# Patient Record
Sex: Male | Born: 1970
Health system: Southern US, Community
[De-identification: ages and names within clinical notes are randomized; demographics above are authoritative.]

## PROBLEM LIST (undated history)

## (undated) DIAGNOSIS — R768 Other specified abnormal immunological findings in serum: Secondary | ICD-10-CM

## (undated) DIAGNOSIS — K259 Gastric ulcer, unspecified as acute or chronic, without hemorrhage or perforation: Secondary | ICD-10-CM

## (undated) DIAGNOSIS — K589 Irritable bowel syndrome without diarrhea: Secondary | ICD-10-CM

## (undated) DIAGNOSIS — A048 Other specified bacterial intestinal infections: Secondary | ICD-10-CM

## (undated) DIAGNOSIS — D759 Disease of blood and blood-forming organs, unspecified: Secondary | ICD-10-CM

## (undated) DIAGNOSIS — K611 Rectal abscess: Secondary | ICD-10-CM

## (undated) DIAGNOSIS — C61 Malignant neoplasm of prostate: Secondary | ICD-10-CM

## (undated) HISTORY — DX: Other specified abnormal immunological findings in serum: R76.8

## (undated) HISTORY — PX: COLONOSCOPY: SHX174

## (undated) HISTORY — DX: Rectal abscess: K61.1

## (undated) HISTORY — DX: Disease of blood and blood-forming organs, unspecified: D75.9

## (undated) HISTORY — DX: Other specified bacterial intestinal infections: A04.8

## (undated) HISTORY — PX: ESOPHAGOGASTRODUODENOSCOPY: SHX1529

## (undated) HISTORY — DX: Irritable bowel syndrome without diarrhea: K58.9

## (undated) HISTORY — DX: Malignant neoplasm of prostate: C61

## (undated) HISTORY — DX: Gastric ulcer, unspecified as acute or chronic, without hemorrhage or perforation: K25.9

## (undated) HISTORY — PX: ANKLE SURGERY: SHX546

---

## 1998-07-12 ENCOUNTER — Emergency Department (HOSPITAL_COMMUNITY): Admission: EM | Admit: 1998-07-12 | Discharge: 1998-07-12 | Payer: Self-pay | Admitting: Emergency Medicine

## 2000-05-12 ENCOUNTER — Emergency Department (HOSPITAL_COMMUNITY): Admission: EM | Admit: 2000-05-12 | Discharge: 2000-05-12 | Payer: Self-pay | Admitting: Emergency Medicine

## 2000-05-12 ENCOUNTER — Encounter: Payer: Self-pay | Admitting: Emergency Medicine

## 2002-05-18 ENCOUNTER — Emergency Department (HOSPITAL_COMMUNITY): Admission: EM | Admit: 2002-05-18 | Discharge: 2002-05-18 | Payer: Self-pay | Admitting: Emergency Medicine

## 2002-09-28 ENCOUNTER — Emergency Department (HOSPITAL_COMMUNITY): Admission: EM | Admit: 2002-09-28 | Discharge: 2002-09-28 | Payer: Self-pay | Admitting: Emergency Medicine

## 2002-12-20 ENCOUNTER — Emergency Department (HOSPITAL_COMMUNITY): Admission: EM | Admit: 2002-12-20 | Discharge: 2002-12-20 | Payer: Self-pay | Admitting: Emergency Medicine

## 2003-09-24 ENCOUNTER — Emergency Department (HOSPITAL_COMMUNITY): Admission: EM | Admit: 2003-09-24 | Discharge: 2003-09-24 | Payer: Self-pay | Admitting: Emergency Medicine

## 2007-02-28 ENCOUNTER — Emergency Department (HOSPITAL_COMMUNITY): Admission: EM | Admit: 2007-02-28 | Discharge: 2007-03-01 | Payer: Self-pay | Admitting: Emergency Medicine

## 2008-12-12 ENCOUNTER — Emergency Department (HOSPITAL_COMMUNITY): Admission: EM | Admit: 2008-12-12 | Discharge: 2008-12-12 | Payer: Self-pay | Admitting: Emergency Medicine

## 2011-06-04 LAB — BASIC METABOLIC PANEL
BUN: 14
CO2: 27
Calcium: 9.2
Chloride: 106
Creatinine, Ser: 1.18
GFR calc Af Amer: >60
GFR calc non Af Amer: >60
Glucose, Bld: 99
Potassium: 4.1
Sodium: 138

## 2011-06-04 LAB — DIFFERENTIAL
Basophils Absolute: 0
Basophils Relative: 0
Eosinophils Absolute: 0.4
Eosinophils Relative: 5
Lymphocytes Relative: 34
Lymphs Abs: 2.7
Monocytes Absolute: 0.7
Monocytes Relative: 8
Neutro Abs: 4.1
Neutrophils Relative %: 53

## 2011-06-04 LAB — CBC
HCT: 43.6
Hemoglobin: 14.7
MCHC: 33.8
MCV: 85.8
Platelets: 172
RBC: 5.08
RDW: 14.1 — ABNORMAL HIGH
WBC: 7.9

## 2012-08-19 DIAGNOSIS — C61 Malignant neoplasm of prostate: Secondary | ICD-10-CM

## 2012-08-19 HISTORY — DX: Malignant neoplasm of prostate: C61

## 2012-08-19 HISTORY — PX: PROSTATE BIOPSY: SHX241

## 2012-12-15 ENCOUNTER — Ambulatory Visit (INDEPENDENT_AMBULATORY_CARE_PROVIDER_SITE_OTHER): Payer: 59 | Admitting: Internal Medicine

## 2012-12-15 VITALS — BP 143/88 | HR 81 | Temp 98.8°F | Resp 18 | Wt 176.0 lb

## 2012-12-15 DIAGNOSIS — L03317 Cellulitis of buttock: Secondary | ICD-10-CM

## 2012-12-15 DIAGNOSIS — F17200 Nicotine dependence, unspecified, uncomplicated: Secondary | ICD-10-CM

## 2012-12-15 DIAGNOSIS — Z720 Tobacco use: Secondary | ICD-10-CM | POA: Insufficient documentation

## 2012-12-15 DIAGNOSIS — L0231 Cutaneous abscess of buttock: Secondary | ICD-10-CM

## 2012-12-15 MED ORDER — DOXYCYCLINE HYCLATE 100 MG PO TABS: 100.0000 mg | ORAL_TABLET | Freq: Two times a day (BID) | ORAL | Status: DC

## 2012-12-15 MED ORDER — HYDROCODONE-ACETAMINOPHEN 10-325 MG PO TABS: 1.0000 | ORAL_TABLET | Freq: Four times a day (QID) | ORAL | Status: DC | PRN

## 2012-12-15 NOTE — Progress Notes (Signed)
  Subjective:    Patient ID: Samuel Diaz, male    DOB: November 26, 1970, 42 y.o.   MRN: [id number]  HPIpainful swelling buttock 8 days No fever Able to have BM today tho pain with sitting and pain kept him awake last pm No meds or underlying problems   Review of Systems negative    Objective:   Physical Exam BP 143/88  Pulse 81  Temp(Src) 98.8 F (37.1 C) (Oral)  Resp 18  Wt 176 lb (79.833 kg) Fluctuant mass Lgluteal cleft approaching but not passing anal verge  Procedure I&D R Dunn PAC      Assessment & Plan:  Gluteal abscess Hot soaks tid Doxycycline Culture F/u 48h

## 2012-12-15 NOTE — Progress Notes (Signed)
   Patient ID: Keiran Gaffey MRN: [id number], DOB: [birth year], 42 y.o. Date of Encounter: 12/15/2012, 10:25 AM    PROCEDURE NOTE: Verbal consent obtained. Risks and benefits of the procedure were explained to the patient. Patient made an informed decision to proceed with the procedure. Betadine prep per usual protocol. Local anesthesia obtained with 2% plain lidocaine 3 cc.  1 cm incision made with 11 blade along lesion.  Culture taken. Copious purulence expressed. Lesion explored revealing no loculations. Does not appear to be dissecting.  Irrigated with normal saline.  Unable to advance packing secondary to soft skin flap.  Patient will perform hot soaks .  Dressed. Wound care instructions including precautions with patient. Patient tolerated the procedure well. Recheck in 48 hours.      SignedEula Listen, PA-C 12/15/2012 10:25 AM

## 2012-12-17 ENCOUNTER — Ambulatory Visit (INDEPENDENT_AMBULATORY_CARE_PROVIDER_SITE_OTHER): Payer: 59 | Admitting: Physician Assistant

## 2012-12-17 VITALS — BP 130/85 | HR 77 | Temp 98.5°F | Resp 18 | Wt 178.0 lb

## 2012-12-17 DIAGNOSIS — L03317 Cellulitis of buttock: Secondary | ICD-10-CM

## 2012-12-17 DIAGNOSIS — L0231 Cutaneous abscess of buttock: Secondary | ICD-10-CM

## 2012-12-17 NOTE — Progress Notes (Signed)
   Patient ID: Zebulun Deman MRN: [id number], DOB: 1971/08/03 42 y.o. Date of Encounter: 12/17/2012, 8:06 AM  Primary Physician: No PCP Per Patient  Chief Complaint: Wound care   See previous note  HPI: 42 y.o. male presents for wound care s/p I&D on 12/15/12 Doing well No issues or complaints Afebrile/ no chills No nausea or vomiting Tolerating doxycycline Pain resolved "I could have jogged here."  Daily dressing change Previous note reviewed  No past medical history on file.   Home Meds: Prior to Admission medications   Medication Sig Start Date End Date Taking? Authorizing Provider  doxycycline (VIBRA-TABS) 100 MG tablet Take 1 tablet (100 mg total) by mouth 2 (two) times daily. 12/15/12  Yes Tonye Pearson, MD  HYDROcodone-acetaminophen (NORCO) 10-325 MG per tablet Take 1 tablet by mouth every 6 (six) hours as needed for pain. 12/15/12  Yes Tonye Pearson, MD    Allergies: No Known Allergies  ROS: Constitutional: Afebrile, no chills Cardiovascular: negative for chest pain or palpitations Dermatological: Positive for wound. Negative for erythema, pain, or warmth   GI: No nausea or vomiting   EXAM: Physical Exam: Blood pressure 130/85, pulse 77, temperature 98.5 F (36.9 C), temperature source Oral, resp. rate 18, weight 178 lb (80.74 kg)., There is no height on file to calculate BMI. General: Well developed, well nourished, in no acute distress. Nontoxic appearing. Head: Normocephalic, atraumatic, sclera non-icteric.  Neck: Supple. Lungs: Breathing is unlabored. Heart: Normal rate. Skin:  Warm and moist. Dressing in place. No induration, erythema, or tenderness to palpation. Neuro: Alert and oriented X 3. Moves all extremities spontaneously. Normal gait.  Psych:  Responds to questions appropriately with a normal affect.   PROCEDURE: Dressing removed. No purulence expressed Wound bed healthy Irrigated with 1% plain lidocaine 5 cc. Dressing  applied  LAB: Culture: pending  A/P: 42 y.o. male with resolved cellulitis/abscess as above s/p I&D on 12/15/12 -Wound care per above -Continue doxycycline -No pain -Continue sitz baths -Daily dressing changes -RTC prn -Patient did ask for remaining week off from work, unfortunately this is not medically indicated at this time  Elinor Dodge, PA-C 12/17/2012 8:06 AM

## 2012-12-18 LAB — WOUND CULTURE: Gram Stain: NONE SEEN

## 2013-03-17 ENCOUNTER — Ambulatory Visit (INDEPENDENT_AMBULATORY_CARE_PROVIDER_SITE_OTHER): Payer: 59 | Admitting: Emergency Medicine

## 2013-03-17 VITALS — BP 130/80 | HR 86 | Temp 98.0°F | Resp 16 | Ht 69.0 in | Wt 169.0 lb

## 2013-03-17 DIAGNOSIS — L0292 Furuncle, unspecified: Secondary | ICD-10-CM

## 2013-03-17 DIAGNOSIS — A048 Other specified bacterial intestinal infections: Secondary | ICD-10-CM

## 2013-03-17 DIAGNOSIS — G8929 Other chronic pain: Secondary | ICD-10-CM

## 2013-03-17 DIAGNOSIS — R1013 Epigastric pain: Secondary | ICD-10-CM

## 2013-03-17 DIAGNOSIS — N4289 Other specified disorders of prostate: Secondary | ICD-10-CM

## 2013-03-17 HISTORY — DX: Other specified bacterial intestinal infections: A04.8

## 2013-03-17 LAB — POCT CBC
Hemoglobin: 14.6 g/dL (ref 14.1–18.1)
MCHC: 32.2 g/dL (ref 31.8–35.4)
MPV: 10.2 fL (ref 0–99.8)
POC Granulocyte: 5 (ref 2–6.9)
POC MID %: 8.3 %M (ref 0–12)
RBC: 4.94 M/uL (ref 4.69–6.13)

## 2013-03-17 LAB — COMPREHENSIVE METABOLIC PANEL
ALT: 13 U/L (ref 0–53)
CO2: 25 mEq/L (ref 19–32)
Chloride: 105 mEq/L (ref 96–112)
Sodium: 141 mEq/L (ref 135–145)
Total Bilirubin: 0.7 mg/dL (ref 0.3–1.2)
Total Protein: 7.4 g/dL (ref 6.0–8.3)

## 2013-03-17 MED ORDER — OMEPRAZOLE 20 MG PO CPDR: 20.0000 mg | DELAYED_RELEASE_CAPSULE | Freq: Every day | ORAL | Status: DC

## 2013-03-17 MED ORDER — DOXYCYCLINE HYCLATE 100 MG PO TABS: 100.0000 mg | ORAL_TABLET | Freq: Two times a day (BID) | ORAL | Status: DC

## 2013-03-17 NOTE — Progress Notes (Signed)
  Subjective:    Patient ID: Samuel Diaz, male    DOB: 10-14-1970, 42 y.o.   MRN: [id number]  HPI problem #1 is chronic intermittent upper abdominal discomfort. He complains of pain sometimes on the right sometimes on the left which seems to come and go. Problem #2 is recurrent boils that he gets in his perianal area and around his buttocks. He has been treated with doxycycline in the past .    Review of Systems     Objective:   Physical Exam Patient is alert and cooperative. He has pearllike areas on his buttocks. He also has a small cystic area in the perineum. Genitourinary exam is normal. Testes are descended. Prostate exam reveals the right lobe of the prostate to be significantly larger than the left       Assessment & Plan:  Routine labs were done. Urological referral made because of the prostate asymmetry. PSA was also done

## 2013-03-18 LAB — H. PYLORI ANTIBODY, IGG: H Pylori IgG: 1.31 {ISR} — ABNORMAL HIGH

## 2013-03-24 ENCOUNTER — Telehealth: Payer: Self-pay

## 2013-03-24 NOTE — Telephone Encounter (Signed)
Pt would like to know if the medication (pt unsure of the name of the medication but he said it was for stomach bacteria)  he was prescribed by Dr.Daub could be called in as a generic because it is too expensive.  Best# [phone]

## 2013-03-24 NOTE — Telephone Encounter (Signed)
3 generic prescription as Prevacid 30 mg twice a day for 14 days Biaxin 500 mg twice a day for 14 days and amoxicillin 1 g twice a day for 14 days please call this prescription in.

## 2013-03-25 MED ORDER — AMOXICILLIN 500 MG PO CAPS: 1000.0000 mg | ORAL_CAPSULE | Freq: Two times a day (BID) | ORAL | Status: DC

## 2013-03-25 MED ORDER — CLARITHROMYCIN 500 MG PO TABS: 500.0000 mg | ORAL_TABLET | Freq: Two times a day (BID) | ORAL | Status: DC

## 2013-03-25 NOTE — Telephone Encounter (Signed)
Done, used omeprazole instead of prevacid, since prevacid not preferred drug/

## 2013-04-16 ENCOUNTER — Encounter: Payer: Self-pay | Admitting: Internal Medicine

## 2013-04-30 ENCOUNTER — Encounter: Payer: Self-pay | Admitting: *Deleted

## 2013-05-12 ENCOUNTER — Encounter: Payer: Self-pay | Admitting: Internal Medicine

## 2013-05-12 ENCOUNTER — Ambulatory Visit (INDEPENDENT_AMBULATORY_CARE_PROVIDER_SITE_OTHER): Payer: 59 | Admitting: Internal Medicine

## 2013-05-12 VITALS — BP 130/90 | HR 60 | Ht 69.5 in | Wt 168.0 lb

## 2013-05-12 DIAGNOSIS — Z8711 Personal history of peptic ulcer disease: Secondary | ICD-10-CM

## 2013-05-12 DIAGNOSIS — K921 Melena: Secondary | ICD-10-CM

## 2013-05-12 DIAGNOSIS — R894 Abnormal immunological findings in specimens from other organs, systems and tissues: Secondary | ICD-10-CM

## 2013-05-12 DIAGNOSIS — R768 Other specified abnormal immunological findings in serum: Secondary | ICD-10-CM

## 2013-05-12 MED ORDER — NA SULFATE-K SULFATE-MG SULF 17.5-3.13-1.6 GM/177ML PO SOLN: ORAL | Status: DC

## 2013-05-12 NOTE — Patient Instructions (Addendum)
You have been scheduled for a colonoscopy with propofol. Please follow written instructions given to you at your visit today.  Please pick up your prep kit at the pharmacy within the next 1-3 days. If you use inhalers (even only as needed), please bring them with you on the day of your procedure. Your physician has requested that you go to www.startemmi.com and enter the access code given to you at your visit today. This web site gives a general overview about your procedure. However, you should still follow specific instructions given to you by our office regarding your preparation for the procedure.  Today we are giving you information on H. Pylori to read.   I appreciate the opportunity to care for you.

## 2013-05-13 ENCOUNTER — Encounter: Payer: Self-pay | Admitting: Internal Medicine

## 2013-05-13 DIAGNOSIS — Z8711 Personal history of peptic ulcer disease: Secondary | ICD-10-CM | POA: Insufficient documentation

## 2013-05-13 DIAGNOSIS — R768 Other specified abnormal immunological findings in serum: Secondary | ICD-10-CM

## 2013-05-13 DIAGNOSIS — K921 Melena: Secondary | ICD-10-CM | POA: Insufficient documentation

## 2013-05-13 DIAGNOSIS — R7689 Other specified abnormal immunological findings in serum: Secondary | ICD-10-CM | POA: Insufficient documentation

## 2013-05-13 HISTORY — DX: Other specified abnormal immunological findings in serum: R76.8

## 2013-05-13 NOTE — Assessment & Plan Note (Signed)
Treated with Biaxin, amoxicillin and PPI with resolution of upper abdominal symptoms.

## 2013-05-13 NOTE — Progress Notes (Signed)
Subjective:    Patient ID: Samuel Diaz, male    DOB: [birth year], 42 y.o.   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 He is undergoing evaluation for abnormal prostate and has been diagnosed with prostate cancer. He describes some hematochezia problems with bright and somewhat darker red blood being passed into the commode. He is a history of gastric ulcers and thought that he might have problems like that again. In the summer time he was complaining of upper abdominal pain and had H. pylori serology was positive, Dr. Cleta Alberts treated him with amoxicillin clarithromycin and PPI and those symptoms resolved. He denies bowel habit changes. No rectal pain swelling or protrusion. He had an upper GI endoscopy about 10 years ago demonstrating peptic ulcer disease, when he was hospitalized. I do not have those records.  No Known Allergies Outpatient Prescriptions Prior to Visit  Medication Sig Dispense Refill  . amoxicillin (AMOXIL) 500 MG capsule Take 2 capsules (1,000 mg total) by mouth 2 (two) times daily.  28 capsule  0  . clarithromycin (BIAXIN) 500 MG tablet Take 1 tablet (500 mg total) by mouth 2 (two) times daily.  28 tablet  0  . doxycycline (VIBRA-TABS) 100 MG tablet Take 1 tablet (100 mg total) by mouth 2 (two) times daily.  20 tablet  0  . HYDROcodone-acetaminophen (NORCO) 10-325 MG per tablet Take 1 tablet by mouth every 6 (six) hours as needed for pain.  30 tablet  0  . omeprazole (PRILOSEC) 20 MG capsule Take 1 capsule (20 mg total) by mouth daily.  30 capsule  3   No facility-administered medications prior to visit.   Past Medical History  Diagnosis Date  . Unspecified diseases of blood and blood-forming organs   . Gastric ulcer, unspecified as acute or chronic, without mention of hemorrhage, perforation, or obstruction    prostate cancer Past Surgical History  Procedure Laterality Date  . Ankle surgery      prostate biopsy History   Social History  . Marital Status: Single    Spouse Name: N/A    Number of Children: N/A  . Years of Education: N/A   Social History Main Topics  . Smoking status: Current Some Day Smoker    Types: Cigars  . Smokeless tobacco: Never Used  . Alcohol Use: Yes     Comment: occassionally  . Drug Use: No     Comment: states he doesn't use marijuana anymore  . Sexual Activity: Yes     Family History  Problem Relation Age of Onset  . Cancer Mother   . Diabetes Mother   . Stroke Father    Review of Systems All other review of systems negative or as per PI.    Objective:   Physical Exam General:  Well-developed, well-nourished and in no acute distress Eyes:  anicteric. ENT:   Mouth and posterior pharynx free of lesions.  Neck:   supple w/o thyromegaly or mass.  Lungs: Clear to auscultation bilaterally. Heart:  S1S2, no rubs, murmurs, gallops. Abdomen:  soft, non-tender, no hepatosplenomegaly, hernia, or mass and BS+.  Rectal: Deferred until colonoscopy Lymph:  no cervical or supraclavicular adenopathy. Extremities:   no edema Skin   no rash. Neuro:  A&O x 3.  Psych:  appropriate mood and  Affect.   Data Reviewed: Lab Results  Component Value Date   WBC 8.4 03/17/2013   HGB 14.6 03/17/2013   HCT 45.4  03/17/2013   MCV 92.0 03/17/2013   PLT 172 03/01/2007   GU notes    Assessment & Plan:   1. Hematochezia   2. History of peptic ulcer   3. Helicobacter pylori ab+    1. Schedule colonoscopy to evaluate the cause of hematochezia. Endoscopy risks please. 2. Explained that even though he remembers having red blood per rectum at the time of his previous ulcer problems a decade or so ago, when he hasn't had or is having does not sound like an upper GI problem it sounds like most likely a colonic source. 3. Status post successful treatment of H. pylori antibody with resolution of dyspepsia.  I appreciate the opportunity to care for this  patient.   CC: DAUB, Stan Head, MD and Lianne Moris, MD

## 2013-05-17 ENCOUNTER — Encounter: Payer: Self-pay | Admitting: Internal Medicine

## 2013-05-31 ENCOUNTER — Encounter: Payer: 59 | Admitting: Internal Medicine

## 2013-08-25 ENCOUNTER — Ambulatory Visit (INDEPENDENT_AMBULATORY_CARE_PROVIDER_SITE_OTHER): Payer: 59 | Admitting: Emergency Medicine

## 2013-08-25 VITALS — BP 128/78 | HR 74 | Temp 99.0°F | Resp 17 | Ht 69.0 in | Wt 165.0 lb

## 2013-08-25 DIAGNOSIS — K6289 Other specified diseases of anus and rectum: Secondary | ICD-10-CM

## 2013-08-25 DIAGNOSIS — L03317 Cellulitis of buttock: Secondary | ICD-10-CM

## 2013-08-25 DIAGNOSIS — L0231 Cutaneous abscess of buttock: Secondary | ICD-10-CM

## 2013-08-25 DIAGNOSIS — K611 Rectal abscess: Secondary | ICD-10-CM

## 2013-08-25 DIAGNOSIS — K612 Anorectal abscess: Secondary | ICD-10-CM

## 2013-08-25 LAB — IFOBT (OCCULT BLOOD): IFOBT: NEGATIVE

## 2013-08-25 MED ORDER — CIPROFLOXACIN HCL 500 MG PO TABS: 500.0000 mg | ORAL_TABLET | Freq: Two times a day (BID) | ORAL | Status: DC

## 2013-08-25 MED ORDER — DOXYCYCLINE HYCLATE 100 MG PO CAPS: 100.0000 mg | ORAL_CAPSULE | Freq: Two times a day (BID) | ORAL | Status: DC

## 2013-08-25 MED ORDER — METRONIDAZOLE 500 MG PO TABS
500.0000 mg | ORAL_TABLET | Freq: Two times a day (BID) | ORAL | Status: DC
Start: 2013-08-25 — End: 2013-10-13

## 2013-08-25 NOTE — Progress Notes (Signed)
Procedure:  Consent obtained - local consent obtained - 2% lido with epi - #11 blade used to make a 2cm - copious purulent malodorous drainage from the wound - packed with 1/4 plain packing - drsg placed.

## 2013-08-25 NOTE — Patient Instructions (Signed)
Dry warm compresses -- recheck in 48h for packing change

## 2013-08-25 NOTE — Progress Notes (Addendum)
   Subjective:    Patient ID: Samuel Diaz, male    DOB: [birth year], 43 y.o.   MRN: [id number] This chart was scribed for Dr. Ernest Haber, MD by Quintin Alto, ED Scribe. This patient was seen in room 12 and the patient's care was started at 9:17 AM.  HPI HPI Comments: Samuel Diaz is a 43 y.o. male who presents to the Emergency Department complaining of a stomach ulcer. Pt states that he believes his symptoms have resulted from recently being on vacation, where he had a few drinks. He denies any rectal bleeding.Pt reports that he has seen a GI doctor, where he was advised to have a colonoscopy, but he did not have it. He states that he has a family history of prostate cancer.    He also reports a boil on his hip  that has not drained.  He says that it seems to be improving since when it first appeared.  Review of Systems  Constitutional: Negative for fever and chills.  Gastrointestinal: Negative for anal bleeding.  Skin:       Boil on left hip.        Objective:   Physical Exam Physical Exam  Nursing note and vitals reviewed. Constitutional: He is oriented to person, place, and time. He appears well-developed and well-nourished. No distress.  HENT:  Head: Normocephalic and atraumatic.  Eyes: Conjunctivae and EOM are normal. No scleral icterus.  Neck: Normal range of motion.  Pulmonary/Chest: Effort normal. No respiratory distress.  Musculoskeletal: Normal range of motion.  Neurological: He is alert and oriented to person, place, and time.  Skin: Skin is warm and dry. No rash noted. He is not diaphoretic. No erythema. No pallor.  Psychiatric: He has a normal mood and affect. His behavior is normal.  Abdomen is soft and nontender. With the patient laying on his left side there is a 2 x 2 centimeter fluctuant area adjacent to the anus. There is a previous scar in this area. Rectal exam was done no masses were felt and there is no tenderness inside of the anal area. DIAGNOSTIC  STUDIES: Oxygen Saturation is 99% on RA, normal by my interpretation.    COORDINATION OF CARE: 9:27 AM- Pt advised of plan for treatment and pt agrees.       Assessment & Plan:    I&D was performed with return of approximately 4 ounces of purulent material with a very foul her. We'll need to cover for anaerobes and gram negatives. Culture was done we'll treat with Cipro 500 twice a day and Flagyl 500 twice a day. Referral made back to Dr. Carlean Purl to evaluate for possible fistulous tract.

## 2013-08-27 ENCOUNTER — Ambulatory Visit (INDEPENDENT_AMBULATORY_CARE_PROVIDER_SITE_OTHER): Payer: 59 | Admitting: Physician Assistant

## 2013-08-27 VITALS — BP 120/72 | HR 59 | Temp 98.7°F | Resp 16 | Ht 68.5 in | Wt 169.0 lb

## 2013-08-27 DIAGNOSIS — K612 Anorectal abscess: Secondary | ICD-10-CM

## 2013-08-27 DIAGNOSIS — L03317 Cellulitis of buttock: Principal | ICD-10-CM

## 2013-08-27 DIAGNOSIS — L0231 Cutaneous abscess of buttock: Secondary | ICD-10-CM

## 2013-08-27 LAB — WOUND CULTURE: Gram Stain: NONE SEEN

## 2013-08-27 NOTE — Progress Notes (Signed)
   Subjective:    Patient ID: Samuel Diaz, male    DOB: 04-23-1971, 43 y.o.   MRN: [id number]  HPI   Samuel Diaz is a very pleasant 43 yr old male here for follow up on a perirectal abscess that was drained here 08/25/12.  He reports he is improving.  Much less tender.  The area continues to drain.  He is taking the antibiotics as directed and tolerating them well.  He has not yet scheduled an appt with Dr. Carlean Purl   Review of Systems  Constitutional: Negative for fever and chills.  Respiratory: Negative.   Cardiovascular: Negative.   Gastrointestinal: Negative.   Skin: Positive for wound.       Objective:   Physical Exam  Vitals reviewed. Constitutional: He is oriented to person, place, and time. He appears well-developed and well-nourished. No distress.  HENT:  Head: Normocephalic and atraumatic.  Eyes: Conjunctivae are normal. No scleral icterus.  Pulmonary/Chest: Effort normal.  Genitourinary:     Healing abscess; no erythema, induration, warmth  Neurological: He is alert and oriented to person, place, and time.  Skin: Skin is warm and dry.  Psychiatric: He has a normal mood and affect. His behavior is normal.   Cx multiple organisms  Wound Care: Packing removed.  Saturated with drainage.  Irrigated with 5cc 1% plain lidocaine.  Scant purulence expressed from the wound.  Repacked with 1/4" plain packing.      Assessment & Plan:  Cellulitis and abscess of buttock  Abscess of anal and rectal regions   Samuel Diaz is a very pleasant 43 yr old male here for wound care following perirectal abscess.  The area is healing well.  Pt feels much improved.  Repacked today.  May not require further packing after this.  Continue abx.  Schedule appt with Dr. Carlean Purl.  Recheck 48 hours   E. Natividad Brood MHS, PA-C [facility] 1/9/20158:55 AM

## 2013-08-29 ENCOUNTER — Ambulatory Visit (INDEPENDENT_AMBULATORY_CARE_PROVIDER_SITE_OTHER): Payer: 59 | Admitting: Physician Assistant

## 2013-08-29 VITALS — BP 144/82 | HR 55 | Temp 98.3°F | Resp 18 | Ht 69.0 in | Wt 166.0 lb

## 2013-08-29 DIAGNOSIS — K611 Rectal abscess: Secondary | ICD-10-CM

## 2013-08-29 DIAGNOSIS — K612 Anorectal abscess: Secondary | ICD-10-CM

## 2013-08-29 NOTE — Progress Notes (Signed)
Patient ID: Samuel Diaz MRN: [id number], DOB: Apr 20, 1971 43 y.o. Date of Encounter: 08/29/2013, 12:24 PM  Chief Complaint: Wound care   See previous note  HPI: 43 y.o. y/o male presents for wound care s/p I&D on 08/25/13.  Doing well No issues or complaints Afebrile/ no chills No nausea or vomiting Tolerating Cipro and Flagyl Pain resolved.  Daily dressing change Previous note reviewed  Past Medical History  Diagnosis Date  . Unspecified diseases of blood and blood-forming organs   . Gastric ulcer, unspecified as acute or chronic, without mention of hemorrhage, perforation, or obstruction   . Prostate cancer 2014     Home Meds: Prior to Admission medications   Medication Sig Start Date End Date Taking? Authorizing Provider  ciprofloxacin (CIPRO) 500 MG tablet Take 1 tablet (500 mg total) by mouth 2 (two) times daily. 08/25/13  Yes Darlyne Russian, MD  metroNIDAZOLE (FLAGYL) 500 MG tablet Take 1 tablet (500 mg total) by mouth 2 (two) times daily. 08/25/13  Yes Darlyne Russian, MD    Allergies: No Known Allergies  ROS: Constitutional: Afebrile, no chills Cardiovascular: negative for chest pain or palpitations Dermatological: Positive for wound. Negative for erythema, pain, or warmth.  GI: No nausea or vomiting   EXAM: Physical Exam: Blood pressure 144/82, pulse 55, temperature 98.3 F (36.8 C), temperature source Oral, resp. rate 18, height 5\' 9"  (1.753 m), weight 166 lb (75.297 kg), SpO2 99.00%., Body mass index is 24.5 kg/(m^2). General: Well developed, well nourished, in no acute distress. Nontoxic appearing. Head: Normocephalic, atraumatic, sclera non-icteric.  Neck: Supple. Lungs: Breathing is unlabored. Heart: Normal rate. Skin:  Warm and moist. Dressing and packing in place. No induration, erythema, or tenderness to palpation. Neuro: Alert and oriented X 3. Moves all extremities spontaneously. Normal gait.  Psych:  Responds to questions appropriately with a normal  affect.       PROCEDURE: Dressing and packing removed. No purulence expressed Wound bed healthy Irrigated with 1% plain lidocaine 5 cc. Not repacked.  Dressing applied  LAB: Culture: multiple organisms.  A/P: 43 y.o. y/o male with perirectal cellulitis/abscess as above s/p I&D on 08/25/13.  Wound care per above Continue antibiotics as directed.  Pain well controlled Daily dressing changes Recheck as needed.   Angela Nevin, PA-C 08/29/2013 12:24 PM

## 2013-10-13 ENCOUNTER — Encounter: Payer: Self-pay | Admitting: Internal Medicine

## 2013-10-13 ENCOUNTER — Ambulatory Visit (INDEPENDENT_AMBULATORY_CARE_PROVIDER_SITE_OTHER): Payer: 59 | Admitting: Internal Medicine

## 2013-10-13 VITALS — BP 140/80 | HR 70 | Ht 68.5 in | Wt 173.4 lb

## 2013-10-13 DIAGNOSIS — R1012 Left upper quadrant pain: Secondary | ICD-10-CM

## 2013-10-13 DIAGNOSIS — K611 Rectal abscess: Secondary | ICD-10-CM

## 2013-10-13 DIAGNOSIS — K612 Anorectal abscess: Secondary | ICD-10-CM

## 2013-10-13 DIAGNOSIS — K921 Melena: Secondary | ICD-10-CM

## 2013-10-13 MED ORDER — NA SULFATE-K SULFATE-MG SULF 17.5-3.13-1.6 GM/177ML PO SOLN: ORAL | Status: DC

## 2013-10-13 NOTE — Patient Instructions (Signed)
You have been scheduled for an endoscopy and colonoscopy with propofol. Please follow the written instructions given to you at your visit today. Please pick up your prep at the pharmacy within the next 1-3 days. If you use inhalers (even only as needed), please bring them with you on the day of your procedure. Your physician has requested that you go to www.startemmi.com and enter the access code given to you at your visit today. This web site gives a general overview about your procedure. However, you should still follow specific instructions given to you by our office regarding your preparation for the procedure.  I appreciate the opportunity to care for you.

## 2013-10-13 NOTE — Progress Notes (Signed)
         Subjective:    Patient ID: Samuel Diaz, male    DOB: 1970/10/11, 43 y.o.   MRN: [id number]  HPI The patient was seen last Fall with c/o minor hematochezia - colonoscopy was recommended and scheduled but the patient cancelled. Since then he has had some intermittent bleeding. He developed a perirectal abscess treated with I and D and Abx at [facility] - resolved. Bowels can be irregular. Also having some LUQ, periumbilical pains. He is concerned about a possible recurrent peptic ulcer.  He notices increased sxs after he drinks heavily. Medications, allergies, past medical history, past surgical history, family history and social history are reviewed and updated in the EMR.  Review of Systems As above    Objective:   Physical Exam WDWN NAD Inspection of anal area shows no abnormality today  Lab Results  Component Value Date   WBC 8.4 03/17/2013   HGB 14.6 03/17/2013   HCT 45.4 03/17/2013   MCV 92.0 03/17/2013   PLT 172 03/01/2007     Chemistry      Component Value Date/Time   NA 141 03/17/2013 0832   K 4.1 03/17/2013 0832   CL 105 03/17/2013 0832   CO2 25 03/17/2013 0832   BUN 18 03/17/2013 0832   CREATININE 1.15 03/17/2013 0832   CREATININE 1.18 03/01/2007 0208      Component Value Date/Time   CALCIUM 9.6 03/17/2013 0832   ALKPHOS 85 03/17/2013 0832   AST 18 03/17/2013 0832   ALT 13 03/17/2013 0832   BILITOT 0.7 03/17/2013 0832         Assessment & Plan:  Hematochezia - Plan: Ambulatory referral to Gastroenterology  LUQ pain - Plan: Ambulatory referral to Gastroenterology  Perirectal abscess  1. Schedule EGD and colonoscopy to evaluate his problems The risks and benefits as well as alternatives of endoscopic procedure(s) have been discussed and reviewed. All questions answered. The patient agrees to proceed. Further plans pending results - I did explain one possibility is IBD (Crohn's) - he seems fixated on the ulcer disease

## 2013-10-14 ENCOUNTER — Encounter: Payer: Self-pay | Admitting: Internal Medicine

## 2013-10-18 ENCOUNTER — Ambulatory Visit: Payer: 59 | Admitting: Internal Medicine

## 2013-12-07 ENCOUNTER — Ambulatory Visit (AMBULATORY_SURGERY_CENTER): Payer: 59 | Admitting: Internal Medicine

## 2013-12-07 ENCOUNTER — Encounter: Payer: Self-pay | Admitting: Internal Medicine

## 2013-12-07 VITALS — BP 122/79 | HR 55 | Temp 98.4°F | Resp 14 | Ht 68.5 in | Wt 173.0 lb

## 2013-12-07 DIAGNOSIS — K921 Melena: Secondary | ICD-10-CM

## 2013-12-07 DIAGNOSIS — K648 Other hemorrhoids: Secondary | ICD-10-CM

## 2013-12-07 DIAGNOSIS — R1012 Left upper quadrant pain: Secondary | ICD-10-CM

## 2013-12-07 MED ORDER — SODIUM CHLORIDE 0.9 % IV SOLN: 500.0000 mL | INTRAVENOUS | Status: DC

## 2013-12-07 NOTE — Op Note (Signed)
[place]  [address] [place], [id number]   COLONOSCOPY PROCEDURE REPORT  PATIENT: Samuel Diaz, Samuel Diaz  MR#: [id number] BIRTHDATE: [birth year] , 42  yrs. old GENDER: Male ENDOSCOPIST: Gatha Mayer, MD, [facility] PROCEDURE DATE:  12/07/2013 PROCEDURE:   Colonoscopy, diagnostic First Screening Colonoscopy - Avg.  risk and is 50 yrs.  old or older - No.  Prior Negative Screening - Now for repeat screening. N/A  History of Adenoma - Now for follow-up colonoscopy & has been > or = to 3 yrs.  N/A  Polyps Removed Today? No.  Recommend repeat exam, <10 yrs? No. ASA CLASS:   Class II INDICATIONS:hematochezia. MEDICATIONS: There was residual sedation effect present from prior procedure, propofol (Diprivan) 100mg  IV, MAC sedation, administered by CRNA, and These medications were titrated to patient response per physician's verbal order  DESCRIPTION OF PROCEDURE:   After the risks benefits and alternatives of the procedure were thoroughly explained, informed consent was obtained.  A digital rectal exam revealed no abnormalities of the rectum, A digital rectal exam revealed no prostatic nodules, and A digital rectal exam revealed the prostate was not enlarged.   The [id number]  endoscope was introduced through the anus and advanced to the cecum, which was identified by both the appendix and ileocecal valve. No adverse events experienced.   The quality of the prep was excellent using Suprep  The instrument was then slowly withdrawn as the colon was fully examined.  COLON FINDINGS: A normal appearing cecum, ileocecal valve, and appendiceal orifice were identified.  The ascending, hepatic flexure, transverse, splenic flexure, descending, sigmoid colon and rectum appeared unremarkable.  No polyps or cancers were seen.   A right colon retroflexion was performed.  Retroflexed views revealed internal hemorrhoids. The time to cecum=1 minutes 45 seconds. Withdrawal time=6  minutes 0 seconds.  The scope was withdrawn and the procedure completed. COMPLICATIONS: There were no complications.  ENDOSCOPIC IMPRESSION: 1.   Normal colon 2.   Internal hemorrhoids  RECOMMENDATIONS: 1.  Repeat colonoscopy 10 years. 2.   consider hemorrhoid banding (can call for f/u) if frequent hemorrhoid problems occur   eSigned:  Gatha Mayer, MD, [facility] 12/07/2013 2:40 PM   cc: The Patient and Arlyss Queen, MD

## 2013-12-07 NOTE — Patient Instructions (Addendum)
The esophagus, stomach and duodenum look ok. I did take a biopsy to make sure the H. Pylori infection is gone and will notify you about the results. You have internal hemorrhoids - this is where the bleeding came from. Otherwise colonoscopy was normal. If the hemorrhoids bother you frequently I can fix them with a procedure (painless) done in the office. Let me know.  I appreciate the opportunity to care for you. Gatha Mayer, MD, FACG   YOU HAD AN ENDOSCOPIC PROCEDURE TODAY AT [facility]: Refer to the procedure report that was given to you for any specific questions about what was found during the examination.  If the procedure report does not answer your questions, please call your gastroenterologist to clarify.  If you requested that your care partner not be given the details of your procedure findings, then the procedure report has been included in a sealed envelope for you to review at your convenience later.  YOU SHOULD EXPECT: Some feelings of bloating in the abdomen. Passage of more gas than usual.  Walking can help get rid of the air that was put into your GI tract during the procedure and reduce the bloating. If you had a lower endoscopy (such as a colonoscopy or flexible sigmoidoscopy) you may notice spotting of blood in your stool or on the toilet paper. If you underwent a bowel prep for your procedure, then you may not have a normal bowel movement for a few days.  DIET: Your first meal following the procedure should be a light meal and then it is ok to progress to your normal diet.  A half-sandwich or bowl of soup is an example of a good first meal.  Heavy or fried foods are harder to digest and may make you feel nauseous or bloated.  Likewise meals heavy in dairy and vegetables can cause extra gas to form and this can also increase the bloating.  Drink plenty of fluids but you should avoid alcoholic beverages for 24 hours.  ACTIVITY: Your care partner should take  you home directly after the procedure.  You should plan to take it easy, moving slowly for the rest of the day.  You can resume normal activity the day after the procedure however you should NOT DRIVE or use heavy machinery for 24 hours (because of the sedation medicines used during the test).    SYMPTOMS TO REPORT IMMEDIATELY: A gastroenterologist can be reached at any hour.  During normal business hours, 8:30 AM to 5:00 PM Monday through Friday, call [phone].  After hours and on weekends, please call the GI answering service at [phone] who will take a message and have the physician on call contact you.   Following lower endoscopy (colonoscopy or flexible sigmoidoscopy):  Excessive amounts of blood in the stool  Significant tenderness or worsening of abdominal pains  Swelling of the abdomen that is new, acute  Fever of 100F or higher  Following upper endoscopy (EGD)  Vomiting of blood or coffee ground material  New chest pain or pain under the shoulder blades  Painful or persistently difficult swallowing  New shortness of breath  Fever of 100F or higher  Black, tarry-looking stools  FOLLOW UP: If any biopsies were taken you will be contacted by phone or by letter within the next 1-3 weeks.  Call your gastroenterologist if you have not heard about the biopsies in 3 weeks.  Our staff will call the home number listed on your records the  next business day following your procedure to check on you and address any questions or concerns that you may have at that time regarding the information given to you following your procedure. This is a courtesy call and so if there is no answer at the home number and we have not heard from you through the emergency physician on call, we will assume that you have returned to your regular daily activities without incident.  SIGNATURES/CONFIDENTIALITY: You and/or your care partner have signed paperwork which will be entered into your electronic  medical record.  These signatures attest to the fact that that the information above on your After Visit Summary has been reviewed and is understood.  Full responsibility of the confidentiality of this discharge information lies with you and/or your care-partner.

## 2013-12-07 NOTE — Progress Notes (Signed)
Called to room to assist during endoscopic procedure.  Patient ID and intended procedure confirmed with present staff. Received instructions for my participation in the procedure from the performing physician.  

## 2013-12-07 NOTE — Op Note (Signed)
[place]  [address] [place], [id number]   ENDOSCOPY PROCEDURE REPORT  PATIENT: Samuel Diaz, Samuel Diaz  MR#: [id number] BIRTHDATE: 1971/06/30 , 42  yrs. old GENDER: Male ENDOSCOPIST: Gatha Mayer, MD, [facility] PROCEDURE DATE:  12/07/2013 PROCEDURE:  EGD w/ biopsy for H.pylori ASA CLASS:     Class II INDICATIONS:  abdominal pain in upper left quadrant. MEDICATIONS: propofol (Diprivan) 200mg  IV, MAC sedation, administered by CRNA, and These medications were titrated to patient response per physician's verbal order TOPICAL ANESTHETIC: none  DESCRIPTION OF PROCEDURE: After the risks benefits and alternatives of the procedure were thoroughly explained, informed consent was obtained.  The LB [id number] endoscope was introduced through the mouth and advanced to the second portion of the duodenum. Without limitations.  The instrument was slowly withdrawn as the mucosa was fully examined.      The upper, middle and distal third of the esophagus were carefully inspected and no abnormalities were noted.  The z-line was well seen at the GEJ.  The endoscope was pushed into the fundus which was normal including a retroflexed view.  The antrum, gastric body, first and second part of the duodenum were unremarkable.  Multiple biopsies were performed from antrum for CLOtest.  Retroflexed views revealed no abnormalities.     The scope was then withdrawn from the patient and the procedure completed.  COMPLICATIONS: There were no complications. ENDOSCOPIC IMPRESSION: Normal EGD; multiple biopsies taken to see if H. pylori has been eradicated (s/p Tx)  RECOMMENDATIONS: Follow-up of helicobacter pylori status, treat if indicated Colonoscopy next   eSigned:  Gatha Mayer, MD, [facility] 12/07/2013 2:36 PM   GB:TDVVOH Everlene Farrier, MD and The Patient

## 2013-12-07 NOTE — Progress Notes (Signed)
Lidocaine-40mg IV prior to Propofol InductionPropofol given over incremental dosages 

## 2013-12-08 ENCOUNTER — Telehealth: Payer: Self-pay

## 2013-12-08 LAB — HELICOBACTER PYLORI SCREEN-BIOPSY: UREASE: NEGATIVE

## 2013-12-08 NOTE — Telephone Encounter (Signed)
Left a message at # [phone] for the pt to call us back if any questions or concerns. maw

## 2013-12-09 ENCOUNTER — Encounter: Payer: Self-pay | Admitting: Internal Medicine

## 2014-03-16 ENCOUNTER — Ambulatory Visit (INDEPENDENT_AMBULATORY_CARE_PROVIDER_SITE_OTHER): Payer: 59 | Admitting: General Surgery

## 2014-03-16 ENCOUNTER — Ambulatory Visit (INDEPENDENT_AMBULATORY_CARE_PROVIDER_SITE_OTHER): Payer: 59 | Admitting: Emergency Medicine

## 2014-03-16 ENCOUNTER — Encounter (INDEPENDENT_AMBULATORY_CARE_PROVIDER_SITE_OTHER): Payer: Self-pay | Admitting: General Surgery

## 2014-03-16 VITALS — BP 132/82 | HR 49 | Temp 98.2°F | Resp 17 | Ht 68.5 in | Wt 165.0 lb

## 2014-03-16 VITALS — BP 154/92 | HR 56 | Temp 98.9°F | Resp 16 | Ht 69.0 in | Wt 164.6 lb

## 2014-03-16 DIAGNOSIS — K612 Anorectal abscess: Secondary | ICD-10-CM

## 2014-03-16 DIAGNOSIS — K611 Rectal abscess: Secondary | ICD-10-CM

## 2014-03-16 HISTORY — DX: Rectal abscess: K61.1

## 2014-03-16 LAB — POCT CBC
Granulocyte percent: 83.9 %G — AB (ref 37–80)
HEMATOCRIT: 45.6 % (ref 43.5–53.7)
HEMOGLOBIN: 14.5 g/dL (ref 14.1–18.1)
LYMPH, POC: 2.1 (ref 0.6–3.4)
MCH: 28.6 pg (ref 27–31.2)
MCHC: 31.8 g/dL (ref 31.8–35.4)
MCV: 90.2 fL (ref 80–97)
MID (cbc): 0.2 (ref 0–0.9)
MPV: 9.3 fL (ref 0–99.8)
POC Granulocyte: 12.1 — AB (ref 2–6.9)
POC LYMPH %: 14.9 % (ref 10–50)
POC MID %: 1.2 %M (ref 0–12)
Platelet Count, POC: 118 10*3/uL — AB (ref 142–424)
RBC: 5.05 M/uL (ref 4.69–6.13)
RDW, POC: 15 %
WBC: 14.4 10*3/uL — AB (ref 4.6–10.2)

## 2014-03-16 MED ORDER — CIPROFLOXACIN HCL 500 MG PO TABS: 500.0000 mg | ORAL_TABLET | Freq: Two times a day (BID) | ORAL | Status: DC

## 2014-03-16 MED ORDER — METRONIDAZOLE 500 MG PO TABS: 500.0000 mg | ORAL_TABLET | Freq: Three times a day (TID) | ORAL | Status: DC

## 2014-03-16 NOTE — Progress Notes (Addendum)
Subjective:  This chart was scribed for Arlyss Queen, MD by Mercy Moore, Medial Scribe. This patient was seen in room 9 and the patient's care was started at 8:54 AM.    Patient ID: Samuel Diaz, male    DOB: 04-30-1971, 43 y.o.   MRN: [id number]  HPI HPI Comments: Samuel Diaz is a 43 y.o. male with history of peptic ulcer who presents to the [facility] complaining of recurrent perirectal abscess and hemorrhoids.   Patient reports recent colonoscopy 12/07/13; found normal.    Patient Active Problem List   Diagnosis Date Noted  . Helicobacter pylori ab+ 05/13/2013  . Hematochezia 05/13/2013  . History of peptic ulcer 05/13/2013  . Nicotine abuse 12/15/2012   Past Medical History  Diagnosis Date  . Unspecified diseases of blood and blood-forming organs   . Gastric ulcer, unspecified as acute or chronic, without mention of hemorrhage, perforation, or obstruction   . Prostate cancer 2014  . H. pylori infection 03/17/2013   Past Surgical History  Procedure Laterality Date  . Ankle surgery Right   . Prostate biopsy  2014    Dr. Tresa Moore  . Esophagogastroduodenoscopy     No Known Allergies Prior to Admission medications   Not on File   History   Social History  . Marital Status: Married    Spouse Name: N/A    Number of Children: 1  . Years of Education: N/A   Occupational History  . [employer]    Social History Main Topics  . Smoking status: Current Every Day Smoker -- 1.00 packs/day    Types: Cigars, Cigarettes    Last Attempt to Quit: 08/19/2013  . Smokeless tobacco: Never Used     Comment: Tobacco info given 10/13/13  . Alcohol Use: 1.2 oz/week    2 Cans of beer per week     Comment: occassionally  . Drug Use: No     Comment: states he doesn't use marijuana anymore  . Sexual Activity: Yes   Other Topics Concern  . 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      Review of Systems    Objective:   Physical Exam  CONSTITUTIONAL: Well developed/well nourished HEAD: Normocephalic/atraumatic EYES: EOMI/PERRL ENMT: Mucous membranes moist NECK: supple no meningeal signs SPINE:entire spine nontender CV: S1/S2 noted, no murmurs/rubs/gallops noted LUNGS: Lungs are clear to auscultation bilaterally, no apparent distress ABDOMEN: soft, nontender, no rebound or guarding GU: no cva tenderness ANAL: Between 3 and 6 o'clock there is a 3cm firm area adjacent to anus; no fluctuance noted RECTAL: No tenderness noted on rectal exam; prostate is normal NEURO: Pt is awake/alert, moves all extremitiesx4 EXTREMITIES: pulses normal, full ROM SKIN: warm, color normal PSYCH: no abnormalities of mood noted  Filed Vitals:   03/16/14 0831  BP: 132/82  Pulse: 49  Temp: 98.2 F (36.8 C)  TempSrc: Oral  Resp: 17  Height: 5' 8.5" (1.74 m)  Weight: 165 lb (74.844 kg)  SpO2: 99%   Results for orders placed in visit on 03/16/14  POCT CBC      Result Value Ref Range   WBC 14.4 (*) 4.6 - 10.2 K/uL   Lymph, poc 2.1  0.6 - 3.4   POC LYMPH PERCENT 14.9  10 - 50 %L   MID (cbc) 0.2  0 - 0.9   POC MID % 1.2  0 - 12 %M  POC Granulocyte 12.1 (*) 2 - 6.9   Granulocyte percent 83.9 (*) 37 - 80 %G   RBC 5.05  4.69 - 6.13 M/uL   Hemoglobin 14.5  14.1 - 18.1 g/dL   HCT, POC 45.6  43.5 - 53.7 %   MCV 90.2  80 - 97 fL   MCH, POC 28.6  27 - 31.2 pg   MCHC 31.8  31.8 - 35.4 g/dL   RDW, POC 15.0     Platelet Count, POC 118 (*) 142 - 424 K/uL   MPV 9.3  0 - 99.8 fL       Assessment & Plan:  Patient placed on Cipro and Flagyl. Emergent referral made to Gen. surgery for their help. This has been a recurrent issue we'll try and get him in to see Gen. surgery in the next 48 hours. His platelet count was low at 118,000. I advised the patient to return to clinic in one month for repeat. If it remains low he will need an evaluation of this. I personally performed the services described in this  documentation, which was scribed in my presence. The recorded information has been reviewed and is accurate.

## 2014-03-16 NOTE — Progress Notes (Signed)
Patient ID: Samuel Diaz, male   DOB: 03/05/1971, 43 y.o.   MRN: [id number]  Chief Complaint  Patient presents with  . Abscess    urgent- eval perirectaql abscess    HPI Samuel Diaz is a 43 y.o. male.  Referred by Dr Nena Jordan HPI This is a 43 year old male who has been seen several times he states for perirectal abscess in the last year. This has been what he terms lanced a couple of times. This area is returned in the last 4 days. He thinks this is around the same area. He has pain. There is been no drainage. He is still having bowel movements. He did have some bright red blood in his also undergone a colonoscopy in the not too distant past. He comes in today to have this evaluated. Past Medical History  Diagnosis Date  . Unspecified diseases of blood and blood-forming organs   . Gastric ulcer, unspecified as acute or chronic, without mention of hemorrhage, perforation, or obstruction   . Prostate cancer 2014  . H. pylori infection 03/17/2013    Past Surgical History  Procedure Laterality Date  . Ankle surgery Right   . Prostate biopsy  2014    Dr. Tresa Moore  . Esophagogastroduodenoscopy      Family History  Problem Relation Age of Onset  . Cancer Mother     ? Breast  . Diabetes Mother   . Stroke Father   . Colon cancer Neg Hx   . Cancer Maternal Grandfather     prostate  . Cancer Paternal Grandfather     prostate    Social History History  Substance Use Topics  . Smoking status: Current Every Day Smoker -- 1.00 packs/day    Types: Cigars, Cigarettes    Last Attempt to Quit: 08/19/2013  . Smokeless tobacco: Never Used     Comment: Tobacco info given 10/13/13  . Alcohol Use: 1.2 oz/week    2 Cans of beer per week     Comment: occassionally    No Known Allergies  Current Outpatient Prescriptions  Medication Sig Dispense Refill  . ciprofloxacin (CIPRO) 500 MG tablet Take 1 tablet (500 mg total) by mouth 2 (two) times daily.  20 tablet  0  . metroNIDAZOLE  (FLAGYL) 500 MG tablet Take 1 tablet (500 mg total) by mouth 3 (three) times daily.  21 tablet  0   No current facility-administered medications for this visit.    Review of Systems Review of Systems  Constitutional: Negative for fever, chills and unexpected weight change.  HENT: Negative for congestion, hearing loss, sore throat, trouble swallowing and voice change.   Eyes: Negative for visual disturbance.  Respiratory: Negative for cough and wheezing.   Cardiovascular: Negative for chest pain, palpitations and leg swelling.  Gastrointestinal: Negative for nausea, vomiting, abdominal pain, diarrhea, constipation, blood in stool, abdominal distention, anal bleeding and rectal pain.  Genitourinary: Negative for hematuria and difficulty urinating.  Musculoskeletal: Negative for arthralgias.  Skin: Negative for rash and wound.  Neurological: Negative for seizures, syncope, weakness and headaches.  Hematological: Negative for adenopathy. Does not bruise/bleed easily.  Psychiatric/Behavioral: Negative for confusion.    Blood pressure 154/92, pulse 56, temperature 98.9 F (37.2 C), temperature source Oral, resp. rate 16, height 5\' 9"  (1.753 m), weight 164 lb 9.6 oz (74.662 kg).  Physical Exam Physical Exam  Vitals reviewed. Constitutional: He appears well-developed and well-nourished.  Genitourinary: Rectal exam shows tenderness.       Data Reviewed Dr  Daubs notes  Assessment    Perirectal abscess     Plan    This appears to be a recurrent perirectal abscess, it appears to have been adequately treated before. He has been placed on antibiotics. I discussed with him today incising and draining this area although there was a fair amount of scarring around it. I cleansed this area with betadine. I anesthetized it. I then made a cruciate incision and drained a fair amount of purulence more than what I expected. I completely evacuated this cavity although there was some scarring  present. I then packed this with iodoform gauze. He can leave this in for 2 days. Will remove it on Friday. He will then begin to pass. He will come back and see Dr. Marcello Moores for recurrent abscess in the next several weeks. He will call sooner if he needs anything before then.        Hutson Luft 03/16/2014, 1:44 PM

## 2014-03-16 NOTE — Patient Instructions (Signed)
Peri-Rectal Abscess  Your caregiver has diagnosed you as having a peri-rectal abscess. This is an infected area near the rectum that is filled with pus. If the abscess is near the surface of the skin, your caregiver may open (incise) the area and drain the pus.  HOME CARE INSTRUCTIONS    If your abscess was opened up and drained. A small piece of gauze may be placed in the opening so that it can drain. Do not remove the gauze unless directed by your caregiver.   A loose dressing may be placed over the abscess site. Change the dressing as often as necessary to keep it clean and dry.   After the drain is removed, the area may be washed with a gentle antiseptic (soap) four times per day.   A warm sitz bath, warm packs or heating pad may be used for pain relief, taking care not to burn yourself.   Return for a wound check in 1 day or as directed.   An "inflatable doughnut" may be used for sitting with added comfort. These can be purchased at a drugstore or medical supply house.   To reduce pain and straining with bowel movements, eat a high fiber diet with plenty of fruits and vegetables. Use stool softeners as recommended by your caregiver. This is especially important if narcotic type pain medications were prescribed as these may cause marked constipation.   Only take over-the-counter or prescription medicines for pain, discomfort, or fever as directed by your caregiver.  SEEK IMMEDIATE MEDICAL CARE IF:    You have increasing pain that is not controlled by medication.   There is increased inflammation (redness), swelling, bleeding, or drainage from the area.   An oral temperature above 102 F (38.9 C) develops.   You develop chills or generalized malaise (feel lethargic or feel "washed out").   You develop any new symptoms (problems) you feel may be related to your present problem.  Document Released: 08/02/2000 Document Revised: 10/28/2011 Document Reviewed: 08/02/2008  [employer] Patient Information  2015 [employer]. This information is not intended to replace advice given to you by your health care provider. Make sure you discuss any questions you have with your health care provider.

## 2014-03-29 ENCOUNTER — Ambulatory Visit (INDEPENDENT_AMBULATORY_CARE_PROVIDER_SITE_OTHER): Payer: 59 | Admitting: General Surgery

## 2014-03-29 VITALS — BP 146/84 | HR 64 | Temp 97.9°F | Ht 69.0 in | Wt 167.0 lb

## 2014-03-29 DIAGNOSIS — K611 Rectal abscess: Secondary | ICD-10-CM

## 2014-03-29 DIAGNOSIS — K612 Anorectal abscess: Secondary | ICD-10-CM

## 2014-03-29 NOTE — Progress Notes (Signed)
Chief Complaint  Patient presents with  . perirectal Abcess    HISTORY: Samuel Diaz is a 43 y.o. male who presents to the office with a recurrent perirectal abscess.  Other symptoms include nothing. He had an abscess 4-5 months ago and one ~2 weeks ago. He has healed well.  He denies any persistent drainage or pain.  his bowel habits are regular and his bowel movements are soft.  his fiber intake is dietary.  his last colonoscopy was a few months ago and normal per pt.  he denies any fecal leakage or loose stools.  He has no personal or family h/o IBD.  he has only had the above described anorectal procedures in the past.    Past Medical History  Diagnosis Date  . Unspecified diseases of blood and blood-forming organs   . Gastric ulcer, unspecified as acute or chronic, without mention of hemorrhage, perforation, or obstruction   . Prostate cancer 2014  . H. pylori infection 03/17/2013      Past Surgical History  Procedure Laterality Date  . Ankle surgery Right   . Prostate biopsy  2014    Dr. Tresa Moore  . Esophagogastroduodenoscopy          Current Outpatient Prescriptions  Medication Sig Dispense Refill  . ciprofloxacin (CIPRO) 500 MG tablet Take 1 tablet (500 mg total) by mouth 2 (two) times daily.  20 tablet  0  . metroNIDAZOLE (FLAGYL) 500 MG tablet Take 1 tablet (500 mg total) by mouth 3 (three) times daily.  21 tablet  0   No current facility-administered medications for this visit.      No Known Allergies    Family History  Problem Relation Age of Onset  . Cancer Mother     ? Breast  . Diabetes Mother   . Stroke Father   . Colon cancer Neg Hx   . Cancer Maternal Grandfather     prostate  . Cancer Paternal Grandfather     prostate    History   Social History  . Marital Status: Married    Spouse Name: N/A    Number of Children: 1  . Years of Education: N/A   Occupational History  . [employer]    Social History Main Topics  . Smoking status: Current Every Day  Smoker -- 1.00 packs/day    Types: Cigars, Cigarettes    Last Attempt to Quit: 08/19/2013  . Smokeless tobacco: Never Used     Comment: Tobacco info given 10/13/13  . Alcohol Use: 1.2 oz/week    2 Cans of beer per week     Comment: occassionally  . Drug Use: No     Comment: states he doesn't use marijuana anymore  . Sexual Activity: Yes   Other Topics Concern  . 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      REVIEW OF SYSTEMS - PERTINENT POSITIVES ONLY: Review of Systems - General ROS: negative for - chills, fever or weight loss Hematological and Lymphatic ROS: negative for - bleeding problems, blood clots or bruising Respiratory ROS: no cough, shortness of breath, or wheezing Cardiovascular ROS: no chest pain or dyspnea on exertion Gastrointestinal ROS: no abdominal pain, change in bowel habits, or black or bloody stools Genito-Urinary ROS: no dysuria, trouble voiding, or hematuria  EXAM: Filed Vitals:   03/29/14 1051  BP: 146/84  Pulse: 64  Temp: 97.9 F (36.6 C)  General appearance: alert and cooperative Resp: clear to auscultation bilaterally Cardio: regular rate and rhythm GI: normal findings: soft, non-tender  Anal Exam Findings: previous abscess scar.  No drainage or granulation tissue. No fluctuance, no internal opening palpated     ASSESSMENT AND PLAN: Samuel Diaz is a 43 y.o. M with a recurrent perirectal abscess.  On exam I do not see any signs of a fistula forming. I do not feel a palpable cord. I do not feel an internal opening. There is no sign of an external opening. I offered him an exam under anesthesia to evaluate this further versus watchful waiting. He has elected to proceed with evaluation for now. He will call the office if he develops any new pain or drainage.     Rosario Adie, MD Colon and Rectal Surgery / [facility]      Visit Diagnoses: No  diagnosis found.  Primary Care Physician: Jenny Reichmann, MD

## 2014-03-29 NOTE — Patient Instructions (Signed)
Call the office if you develop new pain or drainage.

## 2014-05-06 ENCOUNTER — Telehealth: Payer: Self-pay | Admitting: Internal Medicine

## 2014-05-06 NOTE — Telephone Encounter (Signed)
Patient would like to see Dr. Carlean Purl.  He is scheduled for 07/05/14 8:30

## 2014-05-08 ENCOUNTER — Ambulatory Visit (INDEPENDENT_AMBULATORY_CARE_PROVIDER_SITE_OTHER): Payer: 59 | Admitting: Emergency Medicine

## 2014-05-08 VITALS — BP 122/80 | HR 55 | Temp 97.7°F | Resp 18 | Ht 69.0 in | Wt 164.0 lb

## 2014-05-08 DIAGNOSIS — K611 Rectal abscess: Secondary | ICD-10-CM

## 2014-05-08 DIAGNOSIS — K612 Anorectal abscess: Secondary | ICD-10-CM

## 2014-05-08 MED ORDER — METRONIDAZOLE 500 MG PO TABS: 500.0000 mg | ORAL_TABLET | Freq: Three times a day (TID) | ORAL | Status: DC

## 2014-05-08 MED ORDER — CIPROFLOXACIN HCL 500 MG PO TABS: 500.0000 mg | ORAL_TABLET | Freq: Two times a day (BID) | ORAL | Status: DC

## 2014-05-08 NOTE — Progress Notes (Signed)
Subjective:    Patient ID: Samuel Diaz States, male    DOB: Feb 09, 1971, 43 y.o.   MRN: [id number] This chart was scribed for Samuel Diaz. Everlene Farrier, MD by Steva Colder, ED Scribe. The patient was seen in room 12 at 8:46 AM.   Chief Complaint  Patient presents with  . Follow-up    perirectal abscess no better     HPI Samuel Diaz is a 43 y.o. male with a medical hx of perirectal abscess who presents today complaining of F/U on a perirectal abscess. He states that he noticed a pimple 4 days ago. He states that it is now a small abscess that did not drain and is not painful. He states that he has changed his diet to where now he is a vegetarian. He states that the area is no better.  He denies any other associated symptoms. He states that he has been in contact with the two specialist that were recommended by Dr. Everlene Farrier and they were not of much help. Per the pt chart review, Dr. Marcello Moores offered the pt to evaluate his perirectal abscess under anesthesia which the pt decline. He states that he did not know what the specialist was talking about at the time when he declined the procedure.     Patient Active Problem List   Diagnosis Date Noted  . Perirectal abscess 03/16/2014  . Helicobacter pylori ab+ 05/13/2013  . Hematochezia 05/13/2013  . History of peptic ulcer 05/13/2013  . Nicotine abuse 12/15/2012   Past Medical History  Diagnosis Date  . Unspecified diseases of blood and blood-forming organs   . Gastric ulcer, unspecified as acute or chronic, without mention of hemorrhage, perforation, or obstruction   . Prostate cancer 2014  . H. pylori infection 03/17/2013   Past Surgical History  Procedure Laterality Date  . Ankle surgery Right   . Prostate biopsy  2014    Dr. Tresa Moore  . Esophagogastroduodenoscopy     No Known Allergies Prior to Admission medications   Medication Sig Start Date End Date Taking? Authorizing Provider  ciprofloxacin (CIPRO) 500 MG tablet Take 1 tablet (500 mg total) by  mouth 2 (two) times daily. 03/16/14   Darlyne Russian, MD  metroNIDAZOLE (FLAGYL) 500 MG tablet Take 1 tablet (500 mg total) by mouth 3 (three) times daily. 03/16/14   Darlyne Russian, MD      Review of Systems  Skin:       Abscess on the perirectal area       Objective:   Physical Exam  Nursing note and vitals reviewed. Constitutional: He is oriented to person, place, and time. He appears well-developed and well-nourished. No distress.  HENT:  Head: Normocephalic and atraumatic.  Eyes: EOM are normal.  Neck: Neck supple.  Cardiovascular: Normal rate.   Pulmonary/Chest: Effort normal. No respiratory distress.  Genitourinary:  Rectal exam did not confirm any tenderness inside the rectal.   Musculoskeletal: Normal range of motion.  Neurological: He is alert and oriented to person, place, and time.  Skin: Skin is warm and dry.  With the pt laying on his left side, at 2:00 there is a 1 x 2 cm firm area which is not tender to touch.   Psychiatric: He has a normal mood and affect. His behavior is normal.         BP 122/80  Pulse 55  Temp(Src) 97.7 F (36.5 C) (Oral)  Resp 18  Ht 5\' 9"  (1.753 m)  Wt 164 lb (  74.39 kg)  BMI 24.21 kg/m2  SpO2 100%   Assessment & Plan:  I personally performed the services described in this documentation, which was scribed in my presence. The recorded information has been reviewed and is accurate.   Will treat with 7 days of Cipro and Flagyl. Appointment made for re-evaulation by General Surgeon to evaluate his continued symptoms.

## 2014-05-24 ENCOUNTER — Other Ambulatory Visit (INDEPENDENT_AMBULATORY_CARE_PROVIDER_SITE_OTHER): Payer: Self-pay | Admitting: General Surgery

## 2014-05-24 DIAGNOSIS — K604 Rectal fistula: Secondary | ICD-10-CM

## 2014-05-31 ENCOUNTER — Ambulatory Visit
Admission: RE | Admit: 2014-05-31 | Discharge: 2014-05-31 | Disposition: A | Discharge status: Home / Self Care | Payer: 59 | Source: Ambulatory Visit | Attending: General Surgery | Admitting: General Surgery

## 2014-05-31 DIAGNOSIS — K604 Rectal fistula: Secondary | ICD-10-CM

## 2014-05-31 MED ORDER — GADOBENATE DIMEGLUMINE 529 MG/ML IV SOLN
15.0000 mL | Freq: Once | INTRAVENOUS | Status: AC | PRN
  Administered 2014-05-31: 15 mL via INTRAVENOUS

## 2014-06-02 ENCOUNTER — Telehealth (INDEPENDENT_AMBULATORY_CARE_PROVIDER_SITE_OTHER): Payer: Self-pay

## 2014-06-02 NOTE — Telephone Encounter (Signed)
Contact pt to let him know that his MRI showed no fistula or perirectal disease per Dr Marcello Moores. Pt verbalized understanding.

## 2014-07-05 ENCOUNTER — Encounter: Payer: Self-pay | Admitting: Internal Medicine

## 2014-07-05 ENCOUNTER — Ambulatory Visit (INDEPENDENT_AMBULATORY_CARE_PROVIDER_SITE_OTHER): Payer: 59 | Admitting: Internal Medicine

## 2014-07-05 VITALS — BP 148/78 | HR 60 | Ht 69.0 in | Wt 166.5 lb

## 2014-07-05 DIAGNOSIS — K589 Irritable bowel syndrome without diarrhea: Secondary | ICD-10-CM

## 2014-07-05 DIAGNOSIS — R1031 Right lower quadrant pain: Secondary | ICD-10-CM

## 2014-07-05 DIAGNOSIS — R1030 Lower abdominal pain, unspecified: Secondary | ICD-10-CM

## 2014-07-05 DIAGNOSIS — C61 Malignant neoplasm of prostate: Secondary | ICD-10-CM | POA: Insufficient documentation

## 2014-07-05 DIAGNOSIS — R1032 Left lower quadrant pain: Secondary | ICD-10-CM

## 2014-07-05 MED ORDER — HYOSCYAMINE SULFATE 0.125 MG SL SUBL: 0.1250 mg | SUBLINGUAL_TABLET | SUBLINGUAL | Status: DC | PRN

## 2014-07-05 MED ORDER — ALIGN 4 MG PO CAPS: 1.0000 | ORAL_CAPSULE | Freq: Every day | ORAL | Status: AC

## 2014-07-05 NOTE — Patient Instructions (Addendum)
Please follow up with Dr Freida Busman at [facility].   Take Align daily for a month to put the good bacteria back into your colon. Coupon provided.   We have sent the following medications to your pharmacy for you to pick up at your convenience: [place] for your abdominal symptoms   Follow up with [place] as needed.    I appreciate the opportunity to care for you.

## 2014-07-05 NOTE — Progress Notes (Signed)
Subjective:    Patient ID: Samuel Diaz, male    DOB: 24-May-1971, 43 y.o.   MRN: [id number]  HPI Patient is here for follow-up. I had seen him for hematochezia which was related to hemorrhoids and also previous dyspepsia symptoms. EGD and colonoscopy were unrevealing except for the hemorrhoids, a urease test for H. Pylori was negative.  He is complaining of problems every 1-2 weeks of drainage in both lateral quadrants of his abdomen and then a burning sensation in the groins. This is been ongoing intermittently this year. He had a recurrent perirectal abscess in September and was treated with antibiotics by Dr. Everlene Farrier.He saw Dr. Marcello Moores of colorectal surgery and a pelvic MRI was negative for any persistent or recurrent abscess or fistula tract. However it did show his known prostate cancer. No Known Allergies Outpatient Prescriptions Prior to Visit                    No facility-administered medications prior to visit.   Past Medical History  Diagnosis Date  . Unspecified diseases of blood and blood-forming organs   . Gastric ulcer, unspecified as acute or chronic, without mention of hemorrhage, perforation, or obstruction   . Prostate cancer 2014  . H. pylori infection 03/17/2013  . Helicobacter pylori ab+ 05/13/2013  . Perirectal abscess 03/16/2014   Past Surgical History  Procedure Laterality Date  . Ankle surgery Right   . Prostate biopsy  2014    Dr. Tresa Moore  . Esophagogastroduodenoscopy    . Colonoscopy     History   Social History  . Marital Status: Married    Spouse Name: N/A    Number of Children: 1  . Years of Education: N/A   Occupational History  . [employer]    Social History Main Topics  . Smoking status: Current Every Day Smoker -- 1.00 packs/day    Types: Cigars, Cigarettes    Last Attempt to Quit: 08/19/2013  . Smokeless tobacco: Never Used     Comment: Tobacco info given 10/13/13  . Alcohol Use: 1.2 oz/week    2 Cans of beer per week     Comment:  occassionally  . Drug Use: No     Comment: states he doesn't use marijuana anymore  . Sexual Activity: Yes   Other Topics Concern  . 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 He feels well otherwise.    Objective:   Physical Exam well developed well-nourished black man in no acute distress Abdomen is soft and nontender without organomegaly or mass There is no groin adenopathy or abnormality on either side    Assessment & Plan:   1. IBS (irritable bowel syndrome)   2. Bilateral groin pain   3. Prostate cancer    1. I think he probably has some IBS. I will have him use align 1 each day for a month and intermittently Levsin for these symptoms. 2. He will see me as neededI have reassured him as best I can today. If he has persistent problems ongoing before the end of the year we consider a CT abdomen and pelvis though I think it would be unlikely to reveal anything I would prefer he see urology first. 3. He is an observation mode for his prostate cancer and says he is not due to see Dr. Tresa Moore until next June. His groin symptoms are  new since his last visit with Dr. Tresa Moore. My overall level of suspicion that there is serious problem is low but in the setting of known prostate cancer I've asked him to schedule a follow-up visitnow with Dr. Tresa Moore again.he says he will call.    CC: Samuel Diaz, Samuel Sayre, MD [name] MD

## 2015-01-25 ENCOUNTER — Other Ambulatory Visit (HOSPITAL_COMMUNITY): Payer: Self-pay | Admitting: Urology

## 2015-01-25 DIAGNOSIS — C61 Malignant neoplasm of prostate: Secondary | ICD-10-CM

## 2015-02-21 ENCOUNTER — Ambulatory Visit (HOSPITAL_COMMUNITY)
Admission: RE | Admit: 2015-02-21 | Discharge: 2015-02-21 | Disposition: A | Discharge status: Home / Self Care | Payer: 59 | Source: Ambulatory Visit | Attending: Urology | Admitting: Urology

## 2015-02-21 DIAGNOSIS — C61 Malignant neoplasm of prostate: Secondary | ICD-10-CM | POA: Insufficient documentation

## 2015-02-21 MED ORDER — GADOBENATE DIMEGLUMINE 529 MG/ML IV SOLN
15.0000 mL | Freq: Once | INTRAVENOUS | Status: AC | PRN
  Administered 2015-02-21: 15 mL via INTRAVENOUS

## 2015-05-23 ENCOUNTER — Encounter: Payer: Self-pay | Admitting: Emergency Medicine

## 2015-11-13 ENCOUNTER — Ambulatory Visit: Payer: 59 | Admitting: Internal Medicine

## 2015-11-28 ENCOUNTER — Other Ambulatory Visit (INDEPENDENT_AMBULATORY_CARE_PROVIDER_SITE_OTHER): Payer: 59

## 2015-11-28 ENCOUNTER — Encounter: Payer: Self-pay | Admitting: Internal Medicine

## 2015-11-28 ENCOUNTER — Ambulatory Visit (INDEPENDENT_AMBULATORY_CARE_PROVIDER_SITE_OTHER): Payer: 59 | Admitting: Internal Medicine

## 2015-11-28 VITALS — BP 132/80 | HR 60 | Ht 68.5 in | Wt 158.2 lb

## 2015-11-28 DIAGNOSIS — K30 Functional dyspepsia: Secondary | ICD-10-CM

## 2015-11-28 DIAGNOSIS — R634 Abnormal weight loss: Secondary | ICD-10-CM

## 2015-11-28 DIAGNOSIS — F439 Reaction to severe stress, unspecified: Secondary | ICD-10-CM

## 2015-11-28 DIAGNOSIS — Z658 Other specified problems related to psychosocial circumstances: Secondary | ICD-10-CM

## 2015-11-28 LAB — CBC WITH DIFFERENTIAL/PLATELET
Basophils Absolute: 0 10*3/uL (ref 0.0–0.1)
Basophils Relative: 0.3 % (ref 0.0–3.0)
EOS ABS: 0.1 10*3/uL (ref 0.0–0.7)
Eosinophils Relative: 1.4 % (ref 0.0–5.0)
HCT: 41.8 % (ref 39.0–52.0)
Hemoglobin: 13.9 g/dL (ref 13.0–17.0)
LYMPHS PCT: 26.3 % (ref 12.0–46.0)
Lymphs Abs: 1.9 10*3/uL (ref 0.7–4.0)
MCHC: 33.1 g/dL (ref 30.0–36.0)
MCV: 87.9 fl (ref 78.0–100.0)
MONOS PCT: 8.2 % (ref 3.0–12.0)
Monocytes Absolute: 0.6 10*3/uL (ref 0.1–1.0)
NEUTROS ABS: 4.6 10*3/uL (ref 1.4–7.7)
NEUTROS PCT: 63.8 % (ref 43.0–77.0)
PLATELETS: 173 10*3/uL (ref 150.0–400.0)
RBC: 4.76 Mil/uL (ref 4.22–5.81)
RDW: 15 % (ref 11.5–15.5)
WBC: 7.2 10*3/uL (ref 4.0–10.5)

## 2015-11-28 LAB — COMPREHENSIVE METABOLIC PANEL
ALT: 13 U/L (ref 0–53)
AST: 18 U/L (ref 0–37)
Albumin: 4.3 g/dL (ref 3.5–5.2)
Alkaline Phosphatase: 71 U/L (ref 39–117)
BILIRUBIN TOTAL: 0.5 mg/dL (ref 0.2–1.2)
BUN: 18 mg/dL (ref 6–23)
CO2: 30 meq/L (ref 19–32)
Calcium: 9.3 mg/dL (ref 8.4–10.5)
Chloride: 106 mEq/L (ref 96–112)
Creatinine, Ser: 1.08 mg/dL (ref 0.40–1.50)
GFR: 94.87 mL/min (ref >60.00)
GLUCOSE: 100 mg/dL — AB (ref 70–99)
POTASSIUM: 4.1 meq/L (ref 3.5–5.1)
Sodium: 141 mEq/L (ref 135–145)
Total Protein: 6.9 g/dL (ref 6.0–8.3)

## 2015-11-28 LAB — TSH: TSH: 0.68 u[IU]/mL (ref 0.35–4.50)

## 2015-11-28 MED ORDER — PANTOPRAZOLE SODIUM 40 MG PO TBEC: 40.0000 mg | DELAYED_RELEASE_TABLET | Freq: Every day | ORAL | Status: DC

## 2015-11-28 NOTE — Progress Notes (Signed)
   Subjective:    Patient ID: Samuel Diaz, male    DOB: July 21, 1971, 45 y.o.   MRN: [id number] Complaint: Abdominal pain HPI Patient is here for follow-up, last seen in 2015. At that point he had abdominal pain history of H. pylori status post treatment. EGD was unremarkable overall, biopsies with CLOtest negative for H. pylori after treatment. He continues to have postprandial burning abdominal pain in multiple areas. He is concerned about this. 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 Diet is changed also, he has cut out eating fried foods and cut out liquor but still has his abdominal symptoms. He also had a colonoscopy in 2015 because of rectal bleeding and he had hemorrhoids. That is not a persistent problem. Wt Readings from Last 3 Encounters:  11/28/15 158 lb 4 oz (71.782 kg)  02/21/15 166 lb (75.297 kg)  07/05/14 166 lb 8 oz (75.524 kg)   Medications, allergies, past medical history, past surgical history, family history and social history are reviewed and updated in the EMR.   Review of Systems As per history of present illness he sleeps without difficulty he does have fatigue at times He remains an observation mode with Dr. Tresa Moore, for his prostate cancer    Objective:   Physical Exam @BP  132/80 mmHg  Pulse 60  Ht 5' 8.5" (1.74 m)  Wt 158 lb 4 oz (71.782 kg)  BMI 23.71 kg/m2@  General:  NAD Eyes:   anicteric Lungs:  clear Heart:: S1S2 no rubs, murmurs or gallops Abdomen:  soft and nontender, BS+No hepatosplenomegaly or mass Ext:   no edema, cyanosis or clubbing    Data Reviewed:  As per history of present illness        Assessment & Plan:   Encounter Diagnoses  Name Primary?  . Nonulcer dyspepsia Yes  . Loss of weight   . Situational stress    His symptoms are pretty classic for nonulcer dyspepsia I think. Will try pantoprazole 40 mg daily again. Patient  information regarding nonulcer dyspepsia provided to the patient through up-to-date. CBC E Matte will be checked today as well as TSH. He will see me in about 2 or 3 months. If he is having persistent problems consider imaging such as CT scanning.  I appreciate the opportunity to care for this patient. CC: DAUB, Lina Sayre, MD Alexis Frock M.D.

## 2015-11-28 NOTE — Patient Instructions (Addendum)
  Your physician has requested that you go to the basement for the following lab work before leaving today: CBC/diff, CMET, TSH   We have sent the following medications to your pharmacy for you to pick up at your convenience: Pantoprazole   Follow up with Dr Carlean Purl on May 13th at 10:45AM.   I appreciate the opportunity to care for you.

## 2015-11-29 NOTE — Progress Notes (Signed)
Quick Note:  Labs all ok Good news Hope pantoprazole helps Will see him as planned - call back sooner if new problems/worse ______

## 2016-01-30 ENCOUNTER — Ambulatory Visit: Payer: 59 | Admitting: Internal Medicine

## 2016-02-06 DIAGNOSIS — M75100 Unspecified rotator cuff tear or rupture of unspecified shoulder, not specified as traumatic: Secondary | ICD-10-CM | POA: Insufficient documentation

## 2016-03-04 ENCOUNTER — Ambulatory Visit (INDEPENDENT_AMBULATORY_CARE_PROVIDER_SITE_OTHER): Payer: 59 | Admitting: Family Medicine

## 2016-03-04 VITALS — BP 122/70 | HR 60 | Temp 98.9°F | Resp 18 | Ht 68.5 in | Wt 152.0 lb

## 2016-03-04 DIAGNOSIS — K611 Rectal abscess: Secondary | ICD-10-CM | POA: Diagnosis not present

## 2016-03-04 MED ORDER — AMOXICILLIN-POT CLAVULANATE 875-125 MG PO TABS
1.0000 | ORAL_TABLET | Freq: Two times a day (BID) | ORAL | Status: DC
Start: 2016-03-04 — End: 2019-02-09

## 2016-03-04 NOTE — Patient Instructions (Addendum)
IF you received an x-ray today, you will receive an invoice from [facility]. Please contact [facility] at [phone] with questions or concerns regarding your invoice.   IF you received labwork today, you will receive an invoice from [employer]. Please contact [facility] at [phone] with questions or concerns regarding your invoice.   Our billing staff will not be able to assist you with questions regarding bills from these companies.  You will be contacted with the lab results as soon as they are available. The fastest way to get your results is to activate your My Chart account. Instructions are located on the last page of this paperwork. If you have not heard from [place] regarding the results in 2 weeks, please contact this office.    It appears she did have another perirectal abscess. However the center of it is soft and minimal swelling around, I suspect based on your symptoms that some of it may have already tried to drain. Start the antibiotic, warm soaks at least 3-4 times a day if possible, and if not continuing to improve in the next 24-48 hours, either return here, or call [facility] for appointment as they have treated you for this condition in the past. Also based on location, if it does need to be opened, it may need to be done at their facility. Let me know if you have questions in the meantime. Also, you can schedule a physical with me at your convenience.  Perirectal Abscess An abscess is an infected area that contains a collection of pus. A perirectal abscess is an abscess that is near the opening of the anus or around the rectum. A perirectal abscess can cause a lot of pain, especially during bowel movements. CAUSES This condition is almost always caused by an infection that starts in an anal gland. RISK FACTORS This condition is more likely to develop in:  People with diabetes or inflammatory bowel  disease.  People whose body defense system (immune system) is weak.  People who have anal sex.  People who have a sexually transmitted disease (STD).  People who have certain kinds of cancers, such as rectal carcinoma, leukemia, or lymphoma. SYMPTOMS The main symptom of this condition is pain. The pain may be a throbbing pain that gets worse during bowel movements. Other symptoms include:  Fever.  Swelling.  Redness.  Bleeding.  Constipation. DIAGNOSIS The condition is diagnosed with a physical exam. If the abscess is not visible, a health care provider may need to place a finger inside the rectum to find the abscess. Sometimes, imaging tests are done to determine the size and location of the abscess. These tests may include:  An ultrasound.  An MRI.  A CT scan. TREATMENT This condition is usually treated with incision and drainage surgery. Incision and drainage surgery involves making an incision over the abscess to drain the pus. Treatment may also involve antibiotic medicine, pain medicine, stool softeners, or laxatives. HOME CARE INSTRUCTIONS  Take medicines only as directed by your health care provider.  If you were prescribed an antibiotic, finish all of it even if you start to feel better.  To relieve pain, try sitting:  In a warm, shallow bath (sitz bath).  On a heating pad with the setting on low.  On an inflatable donut-shaped cushion.  Follow any diet instructions as directed by your health care provider.  Keep all follow-up visits as directed by your health care provider. This is  important. SEEK MEDICAL CARE IF:  Your abscess is bleeding.  You have pain, swelling, or redness that is getting worse.  You are constipated.  You feel ill.  You have muscle aches or chills.  You have a fever.  Your symptoms return after the abscess has healed.   This information is not intended to replace advice given to you by your health care provider. Make sure  you discuss any questions you have with your health care provider.   Document Released: 08/02/2000 Document Revised: 04/26/2015 Document Reviewed: 06/15/2014 Elsevier Interactive Patient Education [employer].

## 2016-03-04 NOTE — Progress Notes (Signed)
By signing my name below I, Tereasa Coop, attest that this documentation has been prepared under the direction and in the presence of Wendie Agreste, MD. Electonically Signed. Tereasa Coop, Scribe 03/04/2016 at 8:40 AM  Subjective:    Patient ID: Samuel Diaz, male    DOB: Aug 26, 1970, 45 y.o.   MRN: [id number]  Chief Complaint  Patient presents with  . BOIL LEFT HIP    5 DAYS    HPI Kenneth R Plant is a 45 y.o. male who presents to the [facility] complaining of a boil on his left hip for the past 5 days. Pt states boil has not drained and initially got hard. It has softened and become tender. Tenderness has also started to improve. Pt denies any fever or chills. Pt states he had similar boil on the same spot 2 years ago. Pt has history of perirectal abscess on 03/16/14 with recurrent abscess that year. MRI without fistula or perirectal disease in 2015. Upon closer evaluation, pt's boil in on his left buttock just lateral to anus.    Patient Active Problem List   Diagnosis Date Noted  . Rotator cuff syndrome 02/06/2016  . Prostate cancer ([place]) 07/05/2014  . IBS (irritable bowel syndrome) 07/05/2014  . History of peptic ulcer 05/13/2013  . Nicotine abuse 12/15/2012   Past Medical History  Diagnosis Date  . Unspecified diseases of blood and blood-forming organs   . Gastric ulcer, unspecified as acute or chronic, without mention of hemorrhage, perforation, or obstruction   . Prostate cancer ([place]) 2014  . H. pylori infection 03/17/2013  . Helicobacter pylori ab+ 05/13/2013  . Perirectal abscess 03/16/2014  . IBS (irritable bowel syndrome)    Past Surgical History  Procedure Laterality Date  . Ankle surgery Right   . Prostate biopsy  2014    Dr. Tresa Moore  . Esophagogastroduodenoscopy    . Colonoscopy     No Known Allergies Prior to Admission medications   Medication Sig Start Date End Date Taking? Authorizing Provider  pantoprazole (PROTONIX) 40 MG tablet  Take 1 tablet (40 mg total) by mouth daily before breakfast. Patient not taking: Reported on 03/04/2016 11/28/15   Gatha Mayer, MD   Social History   Social History  . Marital Status: Married    Spouse Name: N/A  . Number of Children: 1  . Years of Education: N/A   Occupational History  . [employer]    Social History Main Topics  . Smoking status: Current Every Day Smoker -- 1.00 packs/day    Types: Cigars, Cigarettes    Last Attempt to Quit: 08/19/2013  . Smokeless tobacco: Never Used     Comment: Tobacco info given 10/13/13  . Alcohol Use: 1.2 oz/week    2 Cans of beer per week     Comment: occassionally  . Drug Use: No     Comment: states he doesn't use marijuana anymore  . Sexual Activity: Yes   Other Topics Concern  . 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  Skin:       Positive for perirectal abscess.       Objective:   Physical Exam  Constitutional: He is oriented to person, place, and time. He appears well-developed and well-nourished. No distress.  HENT:  Head: Normocephalic and atraumatic.  Eyes: Conjunctivae  are normal. Pupils are equal, round, and reactive to light.  Neck: Neck supple.  Cardiovascular: Normal rate.   Pulmonary/Chest: Effort normal.  Musculoskeletal: Normal range of motion.  Neurological: He is alert and oriented to person, place, and time.  Skin: Skin is warm and dry.  Pt has an indurated area with central minimal fluctuance on left buttock just lateral to anus. Induration is 3cm out from central opening, there is no active discharge.  Psychiatric: He has a normal mood and affect. His behavior is normal.  Nursing note and vitals reviewed.    Filed Vitals:   03/04/16 0813  BP: 122/70  Pulse: 60  Temp: 98.9 F (37.2 C)  TempSrc: Oral  Resp: 18  Height: 5' 8.5" (1.74 m)  Weight: 152 lb (68.947 kg)  SpO2:  99%        Assessment & Plan:   Samuel Diaz is a 45 y.o. male Perirectal abscess - Plan: amoxicillin-clavulanate (AUGMENTIN) 875-125 MG tablet  History of recurrent perirectal abscess. Current symptoms past 4-5 days, some improvement in swelling recently, suspect it has drained some on its own. Minimal induration on exam, and with some possible improvement in size, may be able to treat with antibiotics alone and warm soaks.  -Start Augmentin 875 mg twice a day.  -Warm soaks, symptomatic care, and if not continuing to improve in the next 24-48 hours, return here or [facility]. Advised that based on location, he may need to have this opened by the surgeon again. RTC precautions if worsening.  Also plans on establishing care with me, advised to schedule physical.  , Meds ordered this encounter  Medications  . amoxicillin-clavulanate (AUGMENTIN) 875-125 MG tablet    Sig: Take 1 tablet by mouth 2 (two) times daily.    Dispense:  20 tablet    Refill:  0   Patient Instructions       IF you received an x-ray today, you will receive an invoice from [facility]. Please contact [facility] at [phone] with questions or concerns regarding your invoice.   IF you received labwork today, you will receive an invoice from [employer]. Please contact [facility] at [phone] with questions or concerns regarding your invoice.   Our billing staff will not be able to assist you with questions regarding bills from these companies.  You will be contacted with the lab results as soon as they are available. The fastest way to get your results is to activate your My Chart account. Instructions are located on the last page of this paperwork. If you have not heard from [place] regarding the results in 2 weeks, please contact this office.    It appears she did have another perirectal abscess. However the center of it is soft and minimal swelling  around, I suspect based on your symptoms that some of it may have already tried to drain. Start the antibiotic, warm soaks at least 3-4 times a day if possible, and if not continuing to improve in the next 24-48 hours, either return here, or call [facility] for appointment as they have treated you for this condition in the past. Also based on location, if it does need to be opened, it may need to be done at their facility. Let me know if you have questions in the meantime. Also, you can schedule a physical with me at your convenience.  Perirectal Abscess An abscess is an infected area that contains a collection of pus. A perirectal  abscess is an abscess that is near the opening of the anus or around the rectum. A perirectal abscess can cause a lot of pain, especially during bowel movements. CAUSES This condition is almost always caused by an infection that starts in an anal gland. RISK FACTORS This condition is more likely to develop in:  People with diabetes or inflammatory bowel disease.  People whose body defense system (immune system) is weak.  People who have anal sex.  People who have a sexually transmitted disease (STD).  People who have certain kinds of cancers, such as rectal carcinoma, leukemia, or lymphoma. SYMPTOMS The main symptom of this condition is pain. The pain may be a throbbing pain that gets worse during bowel movements. Other symptoms include:  Fever.  Swelling.  Redness.  Bleeding.  Constipation. DIAGNOSIS The condition is diagnosed with a physical exam. If the abscess is not visible, a health care provider may need to place a finger inside the rectum to find the abscess. Sometimes, imaging tests are done to determine the size and location of the abscess. These tests may include:  An ultrasound.  An MRI.  A CT scan. TREATMENT This condition is usually treated with incision and drainage surgery. Incision and drainage surgery involves making  an incision over the abscess to drain the pus. Treatment may also involve antibiotic medicine, pain medicine, stool softeners, or laxatives. HOME CARE INSTRUCTIONS  Take medicines only as directed by your health care provider.  If you were prescribed an antibiotic, finish all of it even if you start to feel better.  To relieve pain, try sitting:  In a warm, shallow bath (sitz bath).  On a heating pad with the setting on low.  On an inflatable donut-shaped cushion.  Follow any diet instructions as directed by your health care provider.  Keep all follow-up visits as directed by your health care provider. This is important. SEEK MEDICAL CARE IF:  Your abscess is bleeding.  You have pain, swelling, or redness that is getting worse.  You are constipated.  You feel ill.  You have muscle aches or chills.  You have a fever.  Your symptoms return after the abscess has healed.   This information is not intended to replace advice given to you by your health care provider. Make sure you discuss any questions you have with your health care provider.   Document Released: 08/02/2000 Document Revised: 04/26/2015 Document Reviewed: 06/15/2014 Elsevier Interactive Patient Education [employer].     I personally performed the services described in this documentation, which was scribed in my presence. The recorded information has been reviewed and considered, and addended by me as needed.   Signed,   Merri Ray, MD [facility].  03/04/2016 8:44 AM

## 2016-04-29 MED FILL — diazePAM 10 MG TABS: 10 | 1 days supply | Qty: 1 | Fill #0

## 2016-07-07 IMAGING — MR MR PELVIS WO/W CM
7 of 8 series · 43 of 48 positions shown · IV contrast (multihance)
Comparison: None.

CLINICAL DATA: Recurrent perirectal abscess, treated with
antibiotics, evaluate for underlying perirectal/perianal fistula.
History of prostate cancer.

EXAM:
MRI PELVIS WITHOUT AND WITH CONTRAST
TECHNIQUE: Multiplanar multisequence MR imaging of the pelvis was performed
both before and after administration of intravenous contrast.
CONTRAST:  15mL MULTIHANCE GADOBENATE DIMEGLUMINE 529 MG/ML IV SOLN

[Series 3: t2_tse_sag · sagittal · 2.5mm · 0.81mm/px · 7 of 54 slices shown]
[im 1/54]
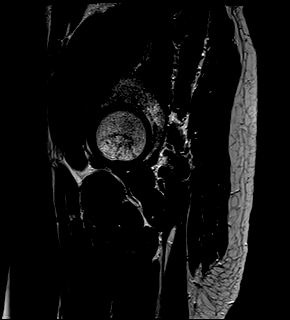
[im 9/54]
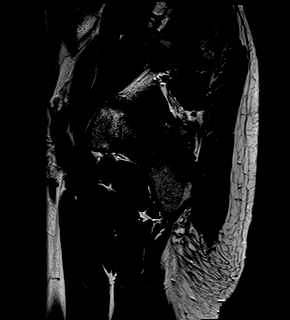
[im 18/54]
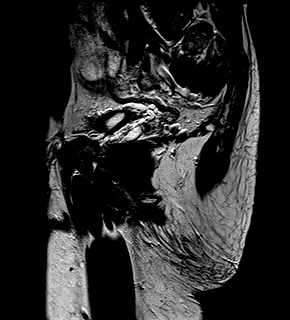
[im 27/54]
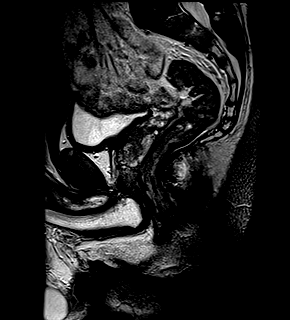
[im 36/54]
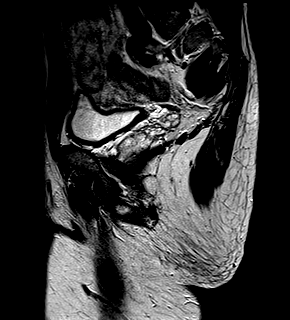
[im 45/54]
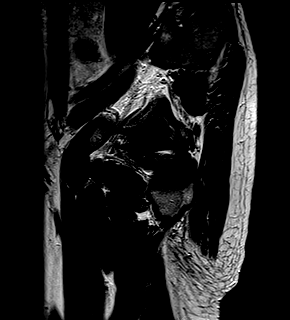
[im 54/54]
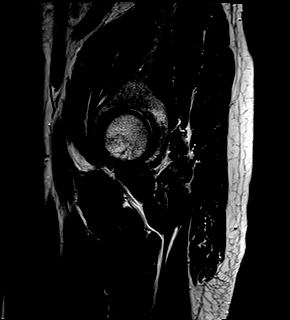

[Series 4: t2_tse_sag fs · sagittal · 2.5mm · 0.81mm/px · 7 of 54 slices shown]
[im 1/54]
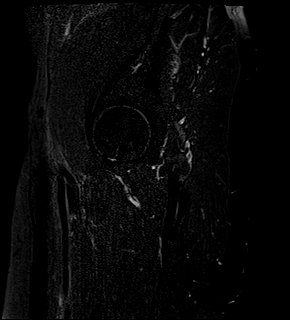
[im 9/54]
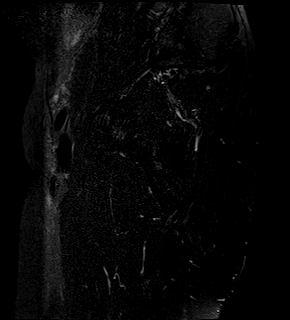
[im 18/54]
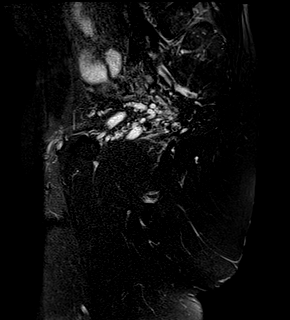
[im 27/54]
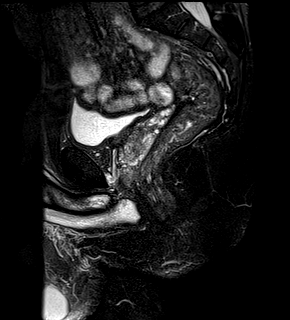
[im 36/54]
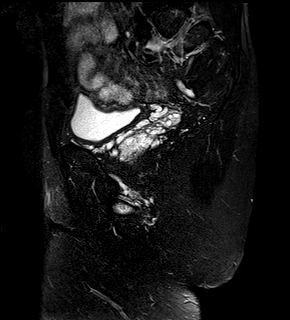
[im 45/54]
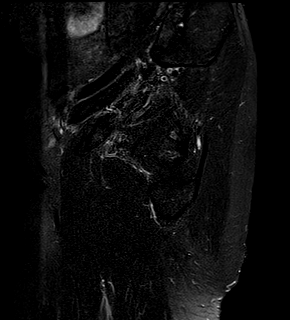
[im 54/54]
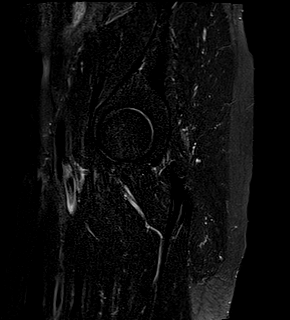

[Series 5: t1_tse axial obl · axial · 4.0mm · 0.57mm/px · z∈[-122,+61]mm · 6 of 40 slices shown (1 of 2)]
[im 1/40]
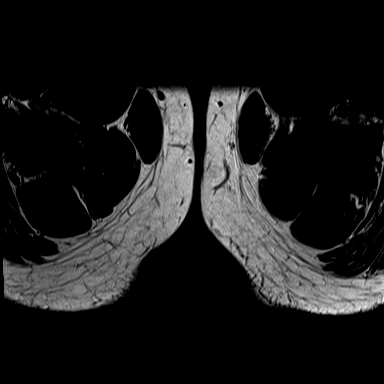
[im 8/40]
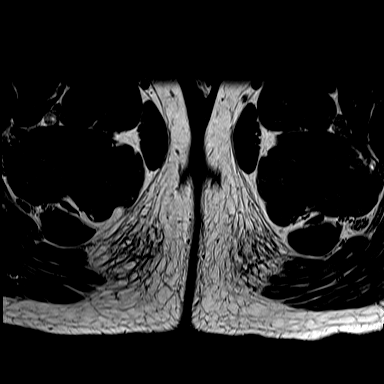
[im 16/40]
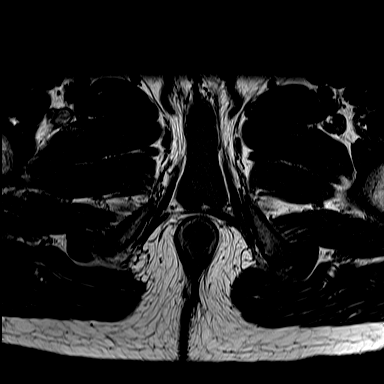
[im 24/40]
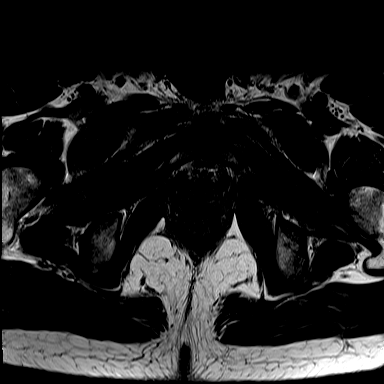
[im 32/40]
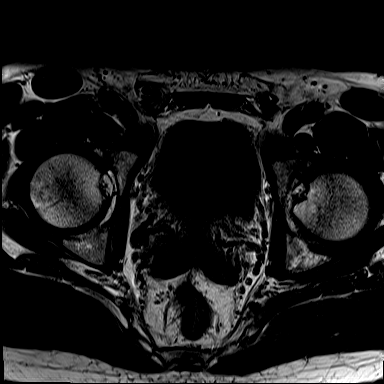
[im 40/40]
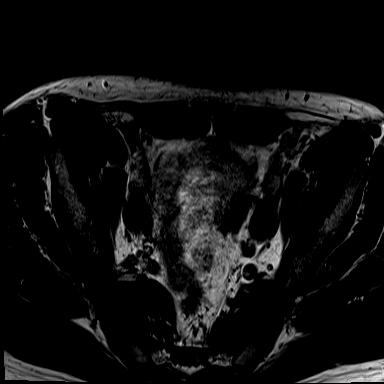

[Series 6: t2_tse axial obl · axial · 4.0mm · 0.69mm/px · z∈[-122,+61]mm · 6 of 40 slices shown (1 of 2)]
[im 1/40]
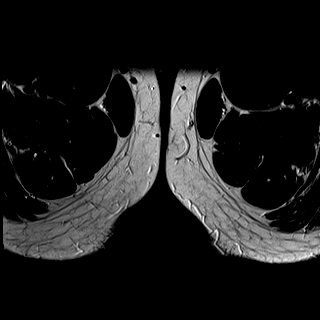
[im 8/40]
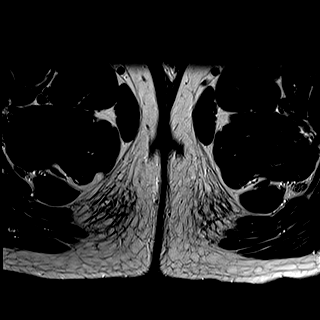
[im 16/40]
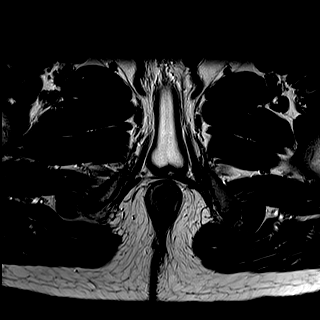
[im 24/40]
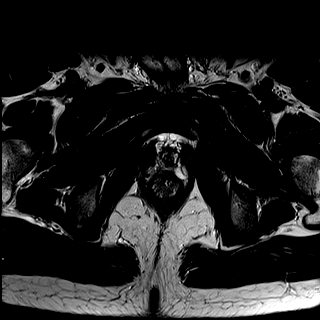
[im 32/40]
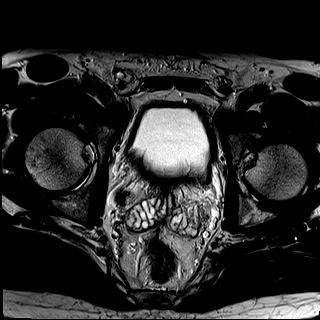
[im 40/40]
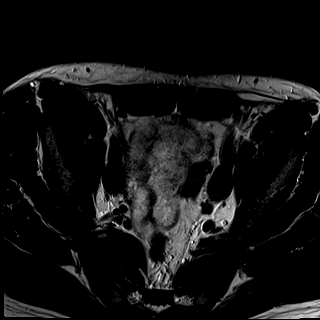

[Series 7: t2_tse axial obl · axial · 4.0mm · 0.69mm/px · z∈[-122,+61]mm · 6 of 40 slices shown (2 of 2)]
[im 1/40]
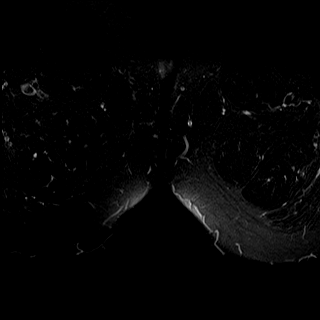
[im 8/40]
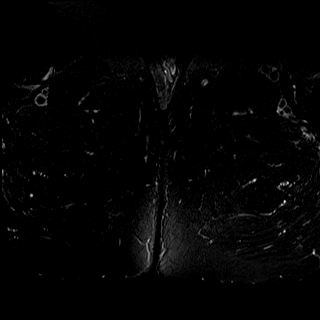
[im 16/40]
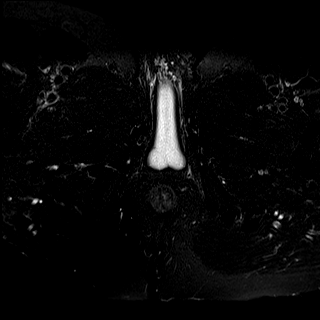
[im 24/40]
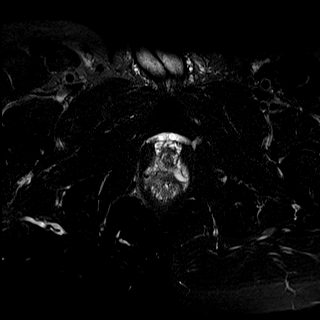
[im 32/40]
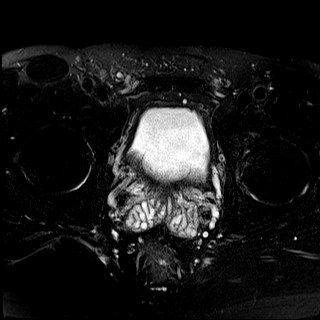
[im 40/40]
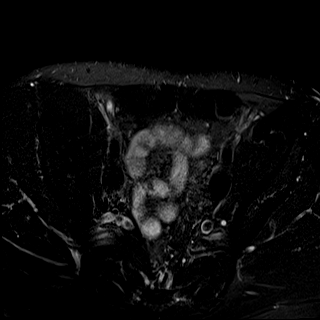

[Series 8: T2 fat-sat · coronal · 4.0mm · 0.75mm/px · 5 of 32 slices shown]
[im 1/32]
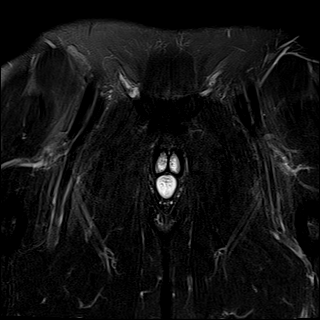
[im 8/32]
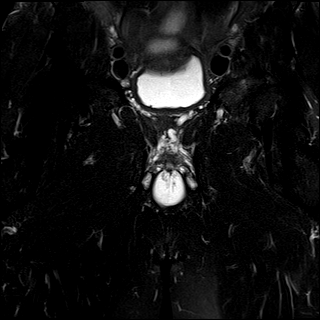
[im 16/32]
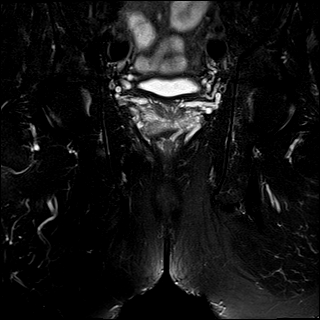
[im 24/32]
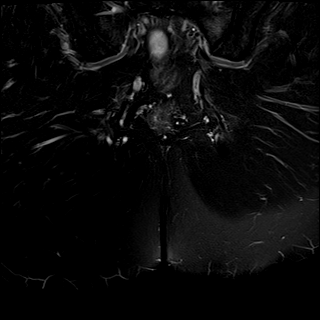
[im 32/32]
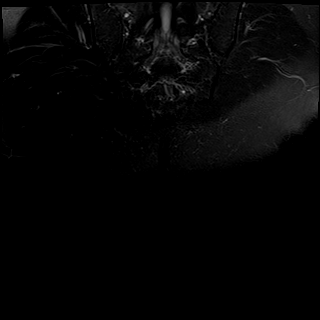

[Series 9: t1_tse axial obl · axial · 4.0mm · 0.62mm/px · z∈[-124,+59]mm · 6 of 40 slices shown (2 of 2)]
[im 1/40]
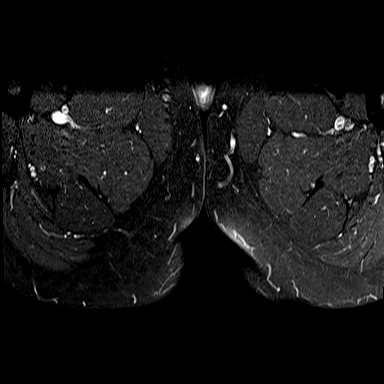
[im 8/40]
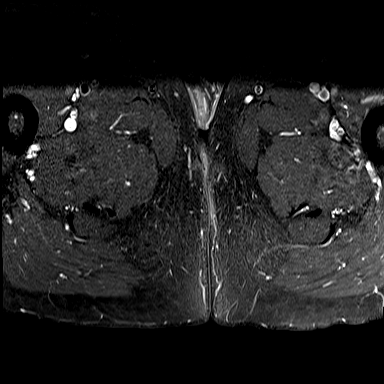
[im 16/40]
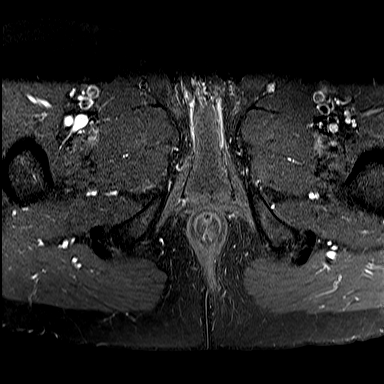
[im 24/40]
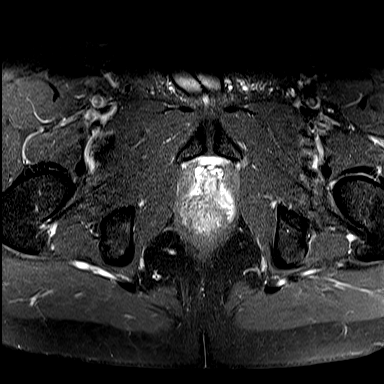
[im 32/40]
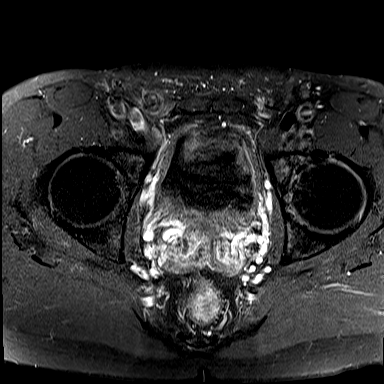
[im 40/40]
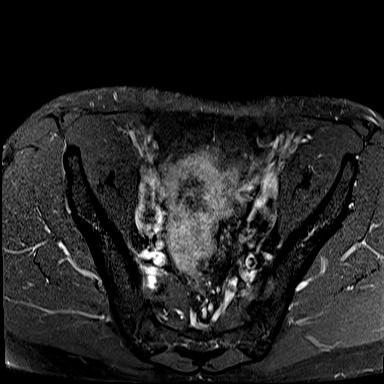

[43 of 48 positions shown; findings below may reference images not displayed]

FINDINGS: No evidence of perirectal abscess, phlegmonous change, or active
inflammation.

No evidence of perianal/perirectal fistula.

Dedicated evaluation of the prostate gland was not performed.
Abnormal low T2 signal in the lateral right mid peripheral gland
(series 6/image 13), possibly corresponding to known prostate
cancer. Additional focal low T2 signal in the left apex (series
6/image 15), possibly reflecting additional tumor.

No suspicious pelvic lymphadenopathy.

No pelvic ascites.

Bladder is mildly thick-walled although underdistended.

No focal osseous lesions.
IMPRESSION: No evidence of perirectal abscess or fistula.

Possible macroscopic prostate cancer in the right mid gland and left
apex, as above.

## 2016-12-17 ENCOUNTER — Ambulatory Visit: Payer: 59 | Admitting: Physician Assistant

## 2017-11-17 ENCOUNTER — Encounter: Payer: Self-pay | Admitting: Physician Assistant

## 2018-07-23 ENCOUNTER — Other Ambulatory Visit: Payer: Self-pay | Admitting: Urology

## 2018-07-23 DIAGNOSIS — C61 Malignant neoplasm of prostate: Secondary | ICD-10-CM

## 2018-07-24 ENCOUNTER — Other Ambulatory Visit: Payer: Self-pay | Admitting: Urology

## 2018-07-24 DIAGNOSIS — C61 Malignant neoplasm of prostate: Secondary | ICD-10-CM

## 2018-08-10 ENCOUNTER — Inpatient Hospital Stay: Admission: RE | Admit: 2018-08-10 | Payer: 59 | Source: Ambulatory Visit

## 2018-08-31 ENCOUNTER — Ambulatory Visit
Admission: RE | Admit: 2018-08-31 | Discharge: 2018-08-31 | Disposition: A | Discharge status: Home / Self Care | Payer: 59 | Source: Ambulatory Visit | Attending: Urology | Admitting: Urology

## 2018-08-31 DIAGNOSIS — C61 Malignant neoplasm of prostate: Secondary | ICD-10-CM

## 2018-08-31 MED ORDER — GADOBENATE DIMEGLUMINE 529 MG/ML IV SOLN
14.0000 mL | Freq: Once | INTRAVENOUS | Status: AC | PRN
  Administered 2018-08-31: 14 mL via INTRAVENOUS

## 2019-02-09 ENCOUNTER — Other Ambulatory Visit: Payer: Self-pay

## 2019-02-09 ENCOUNTER — Encounter: Payer: Self-pay | Admitting: Registered Nurse

## 2019-02-09 ENCOUNTER — Telehealth (INDEPENDENT_AMBULATORY_CARE_PROVIDER_SITE_OTHER): Payer: 59 | Admitting: Registered Nurse

## 2019-02-09 DIAGNOSIS — S2239XD Fracture of one rib, unspecified side, subsequent encounter for fracture with routine healing: Secondary | ICD-10-CM

## 2019-02-09 DIAGNOSIS — Z7689 Persons encountering health services in other specified circumstances: Secondary | ICD-10-CM

## 2019-02-09 MED ORDER — TRAMADOL HCL 50 MG PO TABS: 50.0000 mg | ORAL_TABLET | Freq: Four times a day (QID) | ORAL | 0 refills | Status: AC | PRN
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
## 2019-02-10 ENCOUNTER — Telehealth: Payer: Self-pay | Admitting: Registered Nurse

## 2019-02-10 NOTE — Telephone Encounter (Signed)
Pt dropped off disiability ppr work filled out put in Chiropodist at [place]

## 2019-02-16 NOTE — Telephone Encounter (Signed)
Pt called to follow up on disability paperwork dropped of on 02/10/19. Would like a call when it is ready for pickup. Please advise.

## 2019-03-03 ENCOUNTER — Encounter: Payer: Self-pay | Admitting: Registered Nurse

## 2019-03-03 ENCOUNTER — Ambulatory Visit: Payer: 59 | Admitting: Registered Nurse

## 2019-03-03 ENCOUNTER — Ambulatory Visit (INDEPENDENT_AMBULATORY_CARE_PROVIDER_SITE_OTHER): Payer: 59

## 2019-03-03 ENCOUNTER — Other Ambulatory Visit: Payer: Self-pay

## 2019-03-03 VITALS — BP 118/68 | HR 53 | Temp 98.3°F | Resp 16 | Ht 69.49 in | Wt 147.0 lb

## 2019-03-03 DIAGNOSIS — S2239XD Fracture of one rib, unspecified side, subsequent encounter for fracture with routine healing: Secondary | ICD-10-CM | POA: Diagnosis not present

## 2019-03-03 DIAGNOSIS — R1084 Generalized abdominal pain: Secondary | ICD-10-CM | POA: Diagnosis not present

## 2019-03-03 DIAGNOSIS — S2239XA Fracture of one rib, unspecified side, initial encounter for closed fracture: Secondary | ICD-10-CM

## 2019-03-03 DIAGNOSIS — Z130 Encounter for screening for diseases of the blood and blood-forming organs and certain disorders involving the immune mechanism: Secondary | ICD-10-CM

## 2019-03-03 DIAGNOSIS — Z1329 Encounter for screening for other suspected endocrine disorder: Secondary | ICD-10-CM

## 2019-03-03 DIAGNOSIS — Z13228 Encounter for screening for other metabolic disorders: Secondary | ICD-10-CM | POA: Diagnosis not present
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
## 2019-03-03 NOTE — Patient Instructions (Signed)
° ° ° °  If you have lab work done today you will be contacted with your lab results within the next 2 weeks.  If you have not heard from us then please contact us. The fastest way to get your results is to register for My Chart. ° ° °IF you received an x-ray today, you will receive an invoice from [facility]. Please contact [facility] at [phone] with questions or concerns regarding your invoice.  ° °IF you received labwork today, you will receive an invoice from [facility]. Please contact [facility] at [phone] with questions or concerns regarding your invoice.  ° °Our billing staff will not be able to assist you with questions regarding bills from these companies. ° °You will be contacted with the lab results as soon as they are available. The fastest way to get your results is to activate your My Chart account. Instructions are located on the last page of this paperwork. If you have not heard from us regarding the results in 2 weeks, please contact this office. °  ° ° ° °

## 2019-03-04 LAB — COMPREHENSIVE METABOLIC PANEL
ALT: 13 IU/L (ref 0–44)
AST: 17 IU/L (ref 0–40)
Albumin/Globulin Ratio: 2 (ref 1.2–2.2)
Albumin: 4.4 g/dL (ref 4.0–5.0)
Alkaline Phosphatase: 78 IU/L (ref 39–117)
BUN/Creatinine Ratio: 12 (ref 9–20)
BUN: 15 mg/dL (ref 6–24)
Bilirubin Total: 0.5 mg/dL (ref 0.0–1.2)
CO2: 21 mmol/L (ref 20–29)
Calcium: 9.5 mg/dL (ref 8.7–10.2)
Chloride: 104 mmol/L (ref 96–106)
Creatinine, Ser: 1.21 mg/dL (ref 0.76–1.27)
GFR calc Af Amer: 81 mL/min/{1.73_m2} (ref >59)
GFR calc non Af Amer: 70 mL/min/{1.73_m2} (ref >59)
Globulin, Total: 2.2 g/dL (ref 1.5–4.5)
Glucose: 89 mg/dL (ref 65–99)
Potassium: 4.2 mmol/L (ref 3.5–5.2)
Sodium: 140 mmol/L (ref 134–144)
Total Protein: 6.6 g/dL (ref 6.0–8.5)

## 2019-03-04 LAB — CBC WITH DIFFERENTIAL/PLATELET
Basophils Absolute: 0 10*3/uL (ref 0.0–0.2)
Basos: 1 %
EOS (ABSOLUTE): 0.2 10*3/uL (ref 0.0–0.4)
Eos: 3 %
Hematocrit: 38.6 % (ref 37.5–51.0)
Hemoglobin: 13 g/dL (ref 13.0–17.7)
Immature Grans (Abs): 0 10*3/uL (ref 0.0–0.1)
Immature Granulocytes: 0 %
Lymphocytes Absolute: 2.4 10*3/uL (ref 0.7–3.1)
Lymphs: 28 %
MCH: 30 pg (ref 26.6–33.0)
MCHC: 33.7 g/dL (ref 31.5–35.7)
MCV: 89 fL (ref 79–97)
Monocytes Absolute: 0.6 10*3/uL (ref 0.1–0.9)
Monocytes: 7 %
Neutrophils Absolute: 5.3 10*3/uL (ref 1.4–7.0)
Neutrophils: 61 %
Platelets: 171 10*3/uL (ref 150–450)
RBC: 4.34 x10E6/uL (ref 4.14–5.80)
RDW: 14.6 % (ref 11.6–15.4)
WBC: 8.6 10*3/uL (ref 3.4–10.8)

## 2019-03-04 LAB — HEMOGLOBIN A1C

## 2019-03-24 ENCOUNTER — Other Ambulatory Visit: Payer: Self-pay

## 2019-03-24 ENCOUNTER — Encounter: Payer: Self-pay | Admitting: Registered Nurse

## 2019-03-24 ENCOUNTER — Ambulatory Visit: Payer: 59 | Admitting: Registered Nurse

## 2019-03-24 VITALS — BP 143/76 | HR 75 | Temp 98.5°F | Resp 16 | Wt 147.0 lb

## 2019-03-24 DIAGNOSIS — S2239XD Fracture of one rib, unspecified side, subsequent encounter for fracture with routine healing: Secondary | ICD-10-CM

## 2019-03-24 NOTE — Patient Instructions (Signed)
° ° ° °  If you have lab work done today you will be contacted with your lab results within the next 2 weeks.  If you have not heard from us then please contact us. The fastest way to get your results is to register for My Chart. ° ° °IF you received an x-ray today, you will receive an invoice from [facility]. Please contact  Radiology at [phone] with questions or concerns regarding your invoice.  ° °IF you received labwork today, you will receive an invoice from [facility]. Please contact [facility] at [phone] with questions or concerns regarding your invoice.  ° °Our billing staff will not be able to assist you with questions regarding bills from these companies. ° °You will be contacted with the lab results as soon as they are available. The fastest way to get your results is to activate your My Chart account. Instructions are located on the last page of this paperwork. If you have not heard from us regarding the results in 2 weeks, please contact this office. °  ° ° ° °
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
## 2019-04-20 ENCOUNTER — Ambulatory Visit: Payer: 59 | Admitting: Registered Nurse

## 2019-04-20 ENCOUNTER — Encounter: Payer: Self-pay | Admitting: Registered Nurse

## 2019-04-20 ENCOUNTER — Other Ambulatory Visit: Payer: Self-pay

## 2019-04-20 VITALS — BP 120/70 | HR 51 | Temp 98.5°F | Resp 18 | Wt 144.0 lb

## 2019-04-20 DIAGNOSIS — S2239XD Fracture of one rib, unspecified side, subsequent encounter for fracture with routine healing: Secondary | ICD-10-CM | POA: Diagnosis not present

## 2019-04-20 NOTE — Progress Notes (Signed)
Established Patient Office Visit  Subjective:  Patient ID: Samuel Diaz, male    DOB: 09/28/1970  Age: 48 y.o. MRN: [id number]  CC:  Chief Complaint  Patient presents with  . Closed Fracture    3 week follow-up     HPI [name] presents for follow up visit for broken rib  He continues to see improvement and has been back to work. 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 He is still mostly sleeping in a recliner, as his bed can get uncomfortable after a short while.  Continues to not have any red flag symptoms.   Past Medical History:  Diagnosis Date  . Gastric ulcer, unspecified as acute or chronic, without mention of hemorrhage, perforation, or obstruction   . H. pylori infection 03/17/2013  . Helicobacter pylori ab+ 05/13/2013  . IBS (irritable bowel syndrome)   . Perirectal abscess 03/16/2014  . Prostate cancer ([place]) 2014  . Unspecified diseases of blood and blood-forming organs     Past Surgical History:  Procedure Laterality Date  . ANKLE SURGERY Right   . COLONOSCOPY    . ESOPHAGOGASTRODUODENOSCOPY    . PROSTATE BIOPSY  2014   Dr. Tresa Moore    Family History  Problem Relation Age of Onset  . Cancer Mother        ? Breast  . Diabetes Mother   . Stroke Father   . Prostate cancer Maternal Grandfather   . Prostate cancer Paternal Grandfather   . Colon cancer Neg Hx     Social History   Socioeconomic History  . Marital status: Married    Spouse name: Not on file  . Number of children: 1  . Years of education: Not on file  . Highest education level: Not on file  Occupational History  . Occupation: Metallurgist: Oceanographer  Social Needs  . Financial resource strain: Not hard at all  . Food insecurity    Worry: Never true    Inability: Never true  . Transportation needs    Medical: No    Non-medical: No   Tobacco Use  . Smoking status: Current Every Day Smoker    Packs/day: 0.10    Types: Cigars    Last attempt to quit: 08/19/2013    Years since quitting: 5.6  . Smokeless tobacco: Never Used  . Tobacco comment: Tobacco info given 10/13/13  Substance and Sexual Activity  . Alcohol use: Yes    Alcohol/week: 2.0 standard drinks    Types: 2 Cans of beer per week    Comment: occassionally  . Drug use: No    Types: Marijuana    Comment: states he doesn't use marijuana anymore  . Sexual activity: Yes  Lifestyle  . Physical activity    Days per week: 0 days    Minutes per session: 0 min  . Stress: Only a [employer]  Relationships  . Social Herbalist on phone: Three times a week    Gets together: Twice a week    Attends religious service: Never    Active member of club or organization: Yes    Attends meetings of clubs or organizations: 1 to 4 times per year    Relationship status: Married  . Intimate partner violence    Fear of current or ex partner:  No    Emotionally abused: No    Physically abused: No    Forced sexual activity: No  Other Topics Concern  . 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   No facility-administered medications prior to visit.     No Known Allergies  ROS Review of Systems  Constitutional: Negative.   HENT: Negative.   Eyes: Negative.   Respiratory: Negative.   Cardiovascular: Negative.   Gastrointestinal: Negative.   Endocrine: Negative.   Genitourinary: Negative.   Musculoskeletal: Negative.   Skin: Negative.   Allergic/Immunologic: Negative.   Neurological: Negative.   Hematological: Negative.   Psychiatric/Behavioral: Negative.   All other systems reviewed and are negative.     Objective:    Physical Exam  Constitutional: He is oriented to person, place, and time. He appears well-developed and well-nourished. No distress.   Cardiovascular: Normal rate and regular rhythm.  Pulmonary/Chest: Effort normal. No respiratory distress. He has no wheezes. He has no rales. He exhibits tenderness (at location of injury).  Neurological: He is alert and oriented to person, place, and time.  Skin: Skin is warm and dry. No rash noted. He is not diaphoretic. No erythema. No pallor.  Psychiatric: He has a normal mood and affect. His behavior is normal. Judgment and thought content normal.  Nursing note and vitals reviewed.   BP 120/70   Pulse (!) 51   Temp 98.5 F (36.9 C) (Oral)   Resp 18   Wt 144 lb (65.3 kg)   SpO2 100%   BMI 20.97 kg/m  Wt Readings from Last 3 Encounters:  04/20/19 144 lb (65.3 kg)  03/24/19 147 lb (66.7 kg)  03/03/19 147 lb (66.7 kg)     Health Maintenance Due  Topic Date Due  . HIV Screening  05/21/1985  . TETANUS/TDAP  05/21/1989  . INFLUENZA VACCINE  03/20/2019    There are no preventive care reminders to display for this patient.  Lab Results  Component Value Date   TSH 0.68 11/28/2015   Lab Results  Component Value Date   WBC 8.6 03/03/2019   HGB 13.0 03/03/2019   HCT 38.6 03/03/2019   MCV 89 03/03/2019   PLT 171 03/03/2019   Lab Results  Component Value Date   NA 140 03/03/2019   K 4.2 03/03/2019   CO2 21 03/03/2019   GLUCOSE 89 03/03/2019   BUN 15 03/03/2019   CREATININE 1.21 03/03/2019   BILITOT 0.5 03/03/2019   ALKPHOS 78 03/03/2019   AST 17 03/03/2019   ALT 13 03/03/2019   PROT 6.6 03/03/2019   ALBUMIN 4.4 03/03/2019   CALCIUM 9.5 03/03/2019   GFR 94.87 11/28/2015   No results found for: CHOL No results found for: HDL No results found for: LDLCALC No results found for: TRIG No results found for: CHOLHDL Lab Results  Component Value Date   HGBA1C CANCELED 03/03/2019      Assessment & Plan:   Problem List Items Addressed This Visit    None    Visit Diagnoses    Closed fracture of one rib with routine healing, unspecified laterality, subsequent  encounter    -  Primary   Relevant Orders   Ambulatory referral to Physical Therapy      No orders of the defined types were placed in this encounter.   Follow-up: No follow-ups on file.   PLAN  Ref  to PT. He would benefit from stretching, strengthening and endurance building activities.   I would expect he will be ready to return to full duty within 3 weeks. No follow up necessary. Should his improvement be delayed, he will need a PT consult for further FMLA paperwork  Patient encouraged to call clinic with any questions, comments, or concerns.   Maximiano Coss, NP

## 2019-04-20 NOTE — Patient Instructions (Signed)
° ° ° °  If you have lab work done today you will be contacted with your lab results within the next 2 weeks.  If you have not heard from us then please contact us. The fastest way to get your results is to register for My Chart. ° ° °IF you received an x-ray today, you will receive an invoice from [facility]. Please contact [facility] at [phone] with questions or concerns regarding your invoice.  ° °IF you received labwork today, you will receive an invoice from [facility]. Please contact [facility] at [phone] with questions or concerns regarding your invoice.  ° °Our billing staff will not be able to assist you with questions regarding bills from these companies. ° °You will be contacted with the lab results as soon as they are available. The fastest way to get your results is to activate your My Chart account. Instructions are located on the last page of this paperwork. If you have not heard from us regarding the results in 2 weeks, please contact this office. °  ° ° ° °

## 2024-05-14 ENCOUNTER — Encounter: Payer: Self-pay | Admitting: Internal Medicine

## 2024-07-08 ENCOUNTER — Encounter: Payer: Self-pay | Admitting: Internal Medicine

## 2024-08-16 ENCOUNTER — Encounter

## 2024-08-16 ENCOUNTER — Telehealth: Payer: Self-pay

## 2024-08-16 NOTE — Telephone Encounter (Signed)
 Patient rescheduled pre-visit for 08/23/2024 at 9:00 AM- RM 53

## 2024-08-16 NOTE — Telephone Encounter (Signed)
 RN attempted to call the patient a second time, no answer. Informed patient a pre-visit is needed before he can have his colonoscopy with Dr. Avram. Informed pt to call back before 4:30PM to reschedule his previsit.  If he does not call back by 4:30PM to reschedule his pre-visit, then his colonoscopy will be cancelled.   [facility] number left and reiterated to call back before 4:30PM.

## 2024-08-16 NOTE — Telephone Encounter (Signed)
 RN attempted to call patient, NO answer. [facility] number left if able to call back and reschedule pre-visit.  Informed patient RN would call back in 10 minutes.

## 2024-08-23 ENCOUNTER — Encounter

## 2024-08-30 ENCOUNTER — Encounter: Admitting: Internal Medicine

## 2024-10-25 ENCOUNTER — Encounter: Admitting: Internal Medicine
